# Patient Record
Sex: Female | Born: 2000
Health system: Southern US, Community
[De-identification: ages and names within clinical notes are randomized; demographics above are authoritative.]

## PROBLEM LIST (undated history)

## (undated) DIAGNOSIS — E041 Nontoxic single thyroid nodule: Secondary | ICD-10-CM

## (undated) DIAGNOSIS — Z8489 Family history of other specified conditions: Secondary | ICD-10-CM

## (undated) DIAGNOSIS — F909 Attention-deficit hyperactivity disorder, unspecified type: Secondary | ICD-10-CM

## (undated) DIAGNOSIS — L709 Acne, unspecified: Secondary | ICD-10-CM

## (undated) HISTORY — PX: WISDOM TOOTH EXTRACTION: SHX21

---
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
## 2020-01-19 MED FILL — AMPHETAMINE-DEXTROAMPHETAMI: 20 | 30 days supply | Qty: 30 | Fill #0

## 2020-02-17 DIAGNOSIS — Z0000 Encounter for general adult medical examination without abnormal findings: Secondary | ICD-10-CM | POA: Diagnosis not present

## 2020-04-24 ENCOUNTER — Ambulatory Visit (INDEPENDENT_AMBULATORY_CARE_PROVIDER_SITE_OTHER): Payer: 59 | Admitting: Nurse Practitioner

## 2020-04-24 ENCOUNTER — Encounter: Payer: Self-pay | Admitting: Nurse Practitioner

## 2020-04-24 ENCOUNTER — Other Ambulatory Visit: Payer: Self-pay

## 2020-04-24 VITALS — BP 110/68 | HR 63 | Temp 98.2°F | Ht 68.0 in | Wt 133.0 lb

## 2020-04-24 DIAGNOSIS — Z7689 Persons encountering health services in other specified circumstances: Secondary | ICD-10-CM

## 2020-04-24 DIAGNOSIS — R21 Rash and other nonspecific skin eruption: Secondary | ICD-10-CM | POA: Diagnosis not present

## 2020-04-24 DIAGNOSIS — F909 Attention-deficit hyperactivity disorder, unspecified type: Secondary | ICD-10-CM | POA: Insufficient documentation

## 2020-04-24 NOTE — Assessment & Plan Note (Signed)
Acute, ongoing.  With no itchiness, redness, swelling, or oozing, likely a contact dermatitis or dry skin.  Advised to start daily hydration with non-scented moisturizer and return to clinic if symptoms worsen.

## 2020-04-24 NOTE — Assessment & Plan Note (Signed)
Chronic, ongoing.  Takes Adderall very sparingly, only before exams during school.  Continue to follow with Psychiatrist in [place].

## 2020-04-24 NOTE — Progress Notes (Signed)
BP 110/68   Pulse 63   Temp 98.2 F (36.8 C) (Oral)   Ht 5\' 8"  (1.727 m)   Wt 133 lb (60.3 kg)   SpO2 97%   BMI 20.22 kg/m    Subjective:    Patient ID: Tammie Burns, female    DOB: 2001/05/21, 19 y.o.   MRN: [id number]  HPI: Tammie Burns is a 19 y.o. female presenting for new patient visit to establish care.  Introduced to Designer, jewellery role and practice setting.  All questions answered.  Discussed provider/patient relationship and expectations.  Chief Complaint  Patient presents with  . Establish Care   Patient is a Barrister's clerk at [employer] .    ADHD Follows with a Teacher, music in [place].  Take Adderall 10 mg before test  ADHD status: controlled Satisfied with current therapy: yes Medication compliance:  excellent compliance  Previous psychiatry evaluation: yes; Dr. Darleene Cleaver Previous medications: yes adderall 10 mg   Taking meds on weekends/vacations: yes Work/school performance:  good Difficulty sustaining attention/completing tasks: yes Distracted by extraneous stimuli: yes Does not listen when spoken to: no  Fidgets with hands or feet: no Unable to stay in seat: no Blurts out/interrupts others: no ADHD Medication Side Effects: yes    Decreased appetite: no    Headache: no    Sleeping disturbance pattern: no    Irritability: yes    Rebound effects (worse than baseline) off medication: no    Anxiousness: no    Dizziness: no    Tics: no  RASH Duration:  days  Location: legs  Itching: no Burning: no Redness: no Oozing: no Scaling: no Blisters: no Painful: no Fevers: no Change in detergents/soaps/personal care products: no Recent illness: no Recent travel:no History of same: no Context: same Alleviating factors: nothing tried Treatments attempted: nothing tried Shortness of breath: no  Throat/tongue swelling: no Myalgias/arthralgias: no   No Known Allergies  Outpatient Encounter Medications as of 04/24/2020    Medication Sig  . amphetamine-dextroamphetamine (ADDERALL) 20 MG tablet Take 10 mg by mouth daily.  . norgestimate-ethinyl estradiol (SPRINTEC 28) 0.25-35 MG-MCG tablet TAKE 1 TABLET BY MOUTH EVERY DAY   No facility-administered encounter medications on file as of 04/24/2020.   Active Ambulatory Problems    Diagnosis Date Noted  . ADHD 04/24/2020  . Rash 04/24/2020   Resolved Ambulatory Problems    Diagnosis Date Noted  . No Resolved Ambulatory Problems   No Additional Past Medical History   History reviewed. No pertinent past medical history.  Family History  Problem Relation Age of Onset  . Skin cancer Mother   . Heart disease Father   . Skin cancer Father   . Prostate cancer Father    History reviewed. No pertinent surgical history.  Social History   Tobacco Use  . Smoking status: Never Smoker  . Smokeless tobacco: Never Used  Substance Use Topics  . Alcohol use: Yes    Comment: occassionally  . Drug use: Never   Review of Systems  Constitutional: Negative.  Negative for activity change, appetite change, fatigue and fever.  HENT: Negative.  Negative for congestion, ear pain, postnasal drip, rhinorrhea, sinus pressure, sinus pain, sneezing and sore throat.   Eyes: Negative.  Negative for discharge, redness and visual disturbance.  Respiratory: Negative.  Negative for cough, shortness of breath and wheezing.   Cardiovascular: Negative.  Negative for chest pain, palpitations and leg swelling.  Gastrointestinal: Negative.  Negative for blood in stool, constipation, diarrhea, nausea and vomiting.  Genitourinary: Negative.  Negative for dysuria, hematuria and menstrual problem.  Musculoskeletal: Negative.  Negative for arthralgias, back pain and myalgias.  Skin: Positive for rash. Negative for color change and wound.  Neurological: Negative.  Negative for dizziness, weakness, light-headedness and headaches.  Psychiatric/Behavioral: Negative.  Negative for confusion,  decreased concentration, sleep disturbance and suicidal ideas. The patient is not nervous/anxious.    Per HPI unless specifically indicated above     Objective:    BP 110/68   Pulse 63   Temp 98.2 F (36.8 C) (Oral)   Ht 5\' 8"  (1.727 m)   Wt 133 lb (60.3 kg)   SpO2 97%   BMI 20.22 kg/m   Wt Readings from Last 3 Encounters:  04/24/20 133 lb (60.3 kg) (62 %, Z= 0.31)*   * Growth percentiles are based on CDC (Girls, 2-20 Years) data.    Physical Exam Vitals and nursing note reviewed.  Constitutional:      General: She is not in acute distress.    Appearance: Normal appearance. She is normal weight. She is not toxic-appearing.  HENT:     Head: Normocephalic and atraumatic.     Right Ear: Tympanic membrane, ear canal and external ear normal. There is no impacted cerumen.     Left Ear: Tympanic membrane, ear canal and external ear normal. There is no impacted cerumen.     Nose: Nose normal. No congestion or rhinorrhea.     Mouth/Throat:     Mouth: Mucous membranes are moist.     Pharynx: Oropharynx is clear. No oropharyngeal exudate.  Eyes:     General: No scleral icterus.    Extraocular Movements: Extraocular movements intact.     Pupils: Pupils are equal, round, and reactive to light.  Cardiovascular:     Rate and Rhythm: Normal rate and regular rhythm.     Pulses: Normal pulses.     Heart sounds: Normal heart sounds. No murmur.  Pulmonary:     Effort: Pulmonary effort is normal. No respiratory distress.     Breath sounds: Normal breath sounds. No wheezing, rhonchi or rales.  Abdominal:     General: Abdomen is flat. Bowel sounds are normal. There is no distension.     Palpations: There is no mass.  Musculoskeletal:        General: Normal range of motion.     Cervical back: Normal range of motion. No rigidity.     Right lower leg: No edema.     Left lower leg: No edema.  Skin:    General: Skin is warm and dry.     Capillary Refill: Capillary refill takes less than 2  seconds.     Coloration: Skin is not jaundiced or pale.     Findings: Rash (papular rash to bilateral lower extremities) present.  Neurological:     General: No focal deficit present.     Mental Status: She is alert.     Motor: No weakness.     Gait: Gait normal.  Psychiatric:        Mood and Affect: Mood normal.        Behavior: Behavior normal.        Thought Content: Thought content normal.        Judgment: Judgment normal.       Assessment & Plan:   Problem List Items Addressed This Visit      Musculoskeletal and Integument   Rash    Acute, ongoing.  With no itchiness, redness, swelling,  or oozing, likely a contact dermatitis or dry skin.  Advised to start daily hydration with non-scented moisturizer and return to clinic if symptoms worsen.        Other   ADHD - Primary    Chronic, ongoing.  Takes Adderall very sparingly, only before exams during school.  Continue to follow with Psychiatrist in [place].       Other Visit Diagnoses    Encounter to establish care           Follow up plan: Return if symptoms worsen or fail to improve.

## 2020-05-12 ENCOUNTER — Ambulatory Visit: Payer: 59

## 2020-05-17 ENCOUNTER — Ambulatory Visit: Payer: 59

## 2020-05-19 ENCOUNTER — Ambulatory Visit
Admission: RE | Admit: 2020-05-19 | Discharge: 2020-05-19 | Disposition: A | Discharge status: Home / Self Care | Payer: 59 | Source: Ambulatory Visit | Attending: Otolaryngology | Admitting: Otolaryngology

## 2020-05-19 ENCOUNTER — Other Ambulatory Visit: Payer: Self-pay

## 2020-05-19 DIAGNOSIS — E041 Nontoxic single thyroid nodule: Secondary | ICD-10-CM | POA: Insufficient documentation

## 2020-05-19 DIAGNOSIS — E042 Nontoxic multinodular goiter: Secondary | ICD-10-CM | POA: Diagnosis not present

## 2020-05-23 ENCOUNTER — Other Ambulatory Visit: Payer: Self-pay | Admitting: Otolaryngology

## 2020-05-23 DIAGNOSIS — E041 Nontoxic single thyroid nodule: Secondary | ICD-10-CM

## 2020-08-14 DIAGNOSIS — F900 Attention-deficit hyperactivity disorder, predominantly inattentive type: Secondary | ICD-10-CM | POA: Diagnosis not present

## 2020-09-18 IMAGING — US US THYROID
1 series · 15 of 25 positions shown · non-contrast
Comparison: 05/12/2019

CLINICAL DATA: 19-year-old female with a history of thyroid nodules

EXAM:
THYROID ULTRASOUND
TECHNIQUE: Ultrasound examination of the thyroid gland and adjacent soft
tissues was performed.

[Series 1: us thyroid · 15 of 44 slices shown]
[im 1/44]
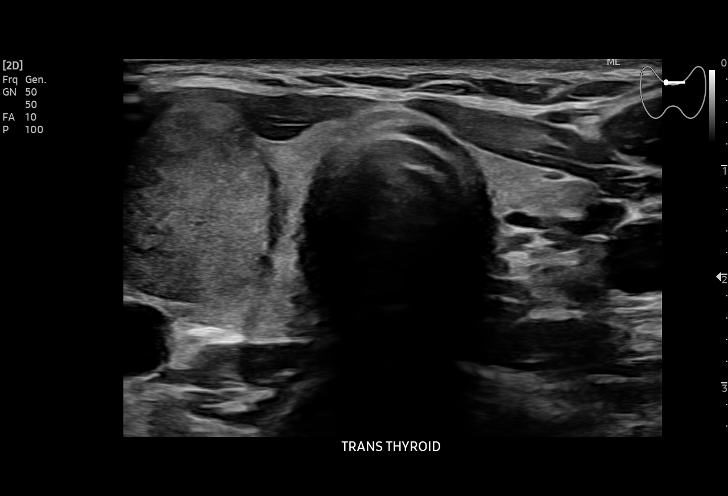
[im 4/44]
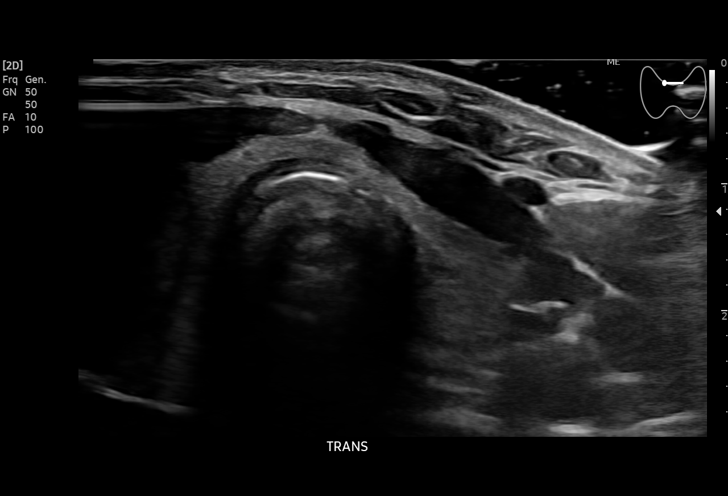
[im 8/44]
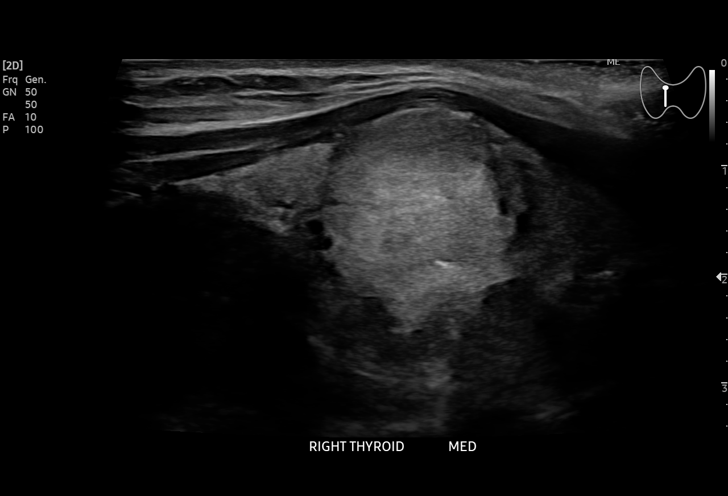
[im 9/44]
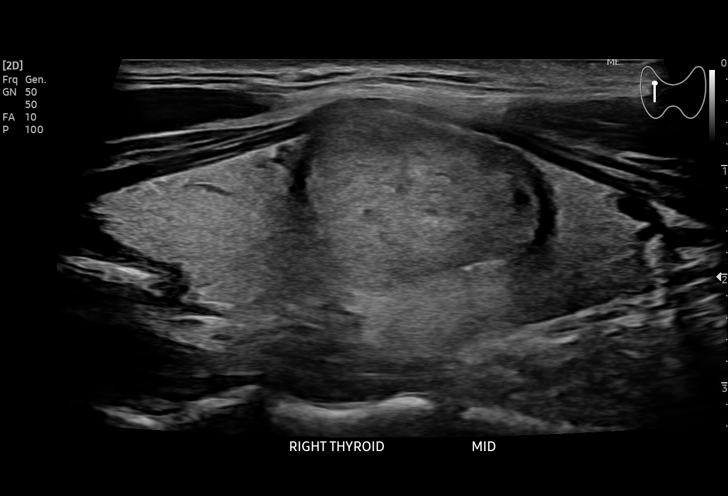
[im 13/44]
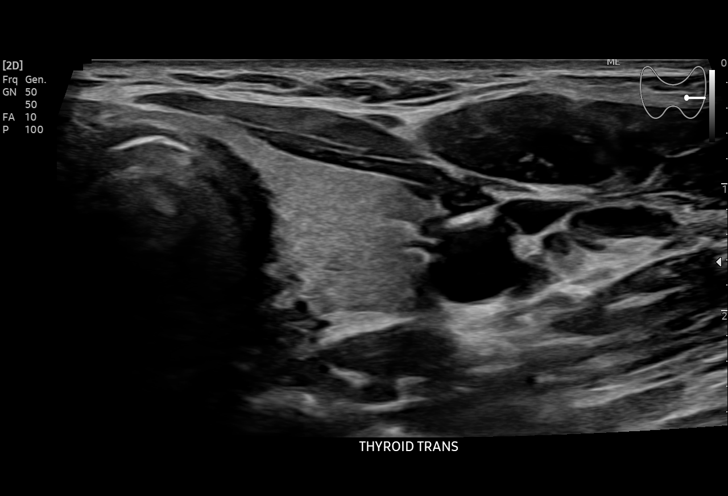
[im 17/44]
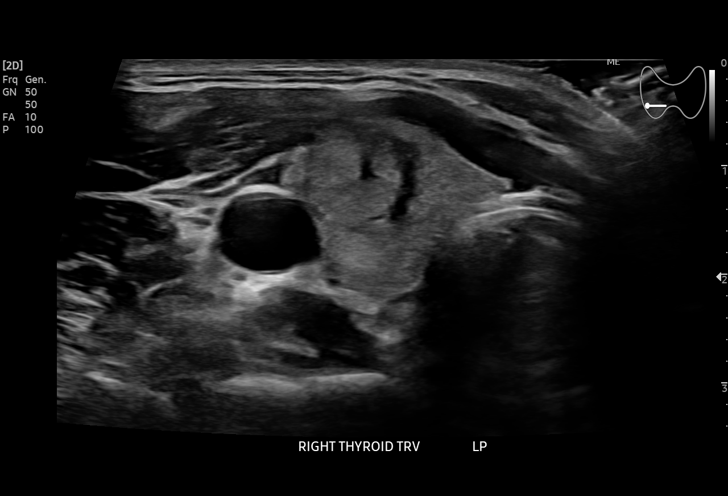
[im 18/44]
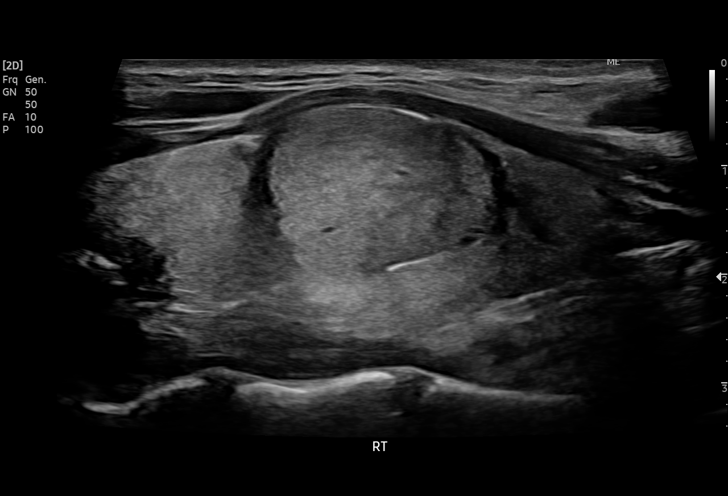
[im 22/44]
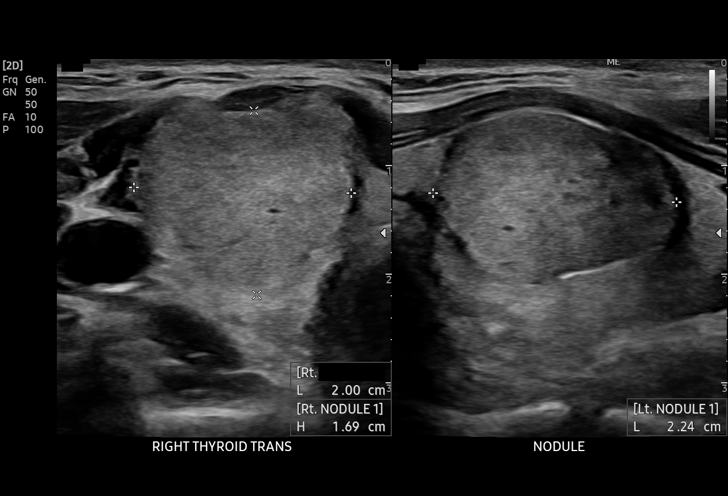
[im 26/44]
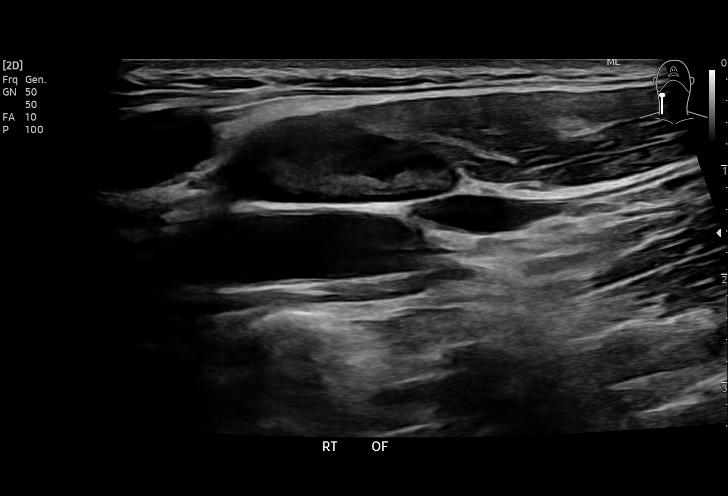
[im 27/44]
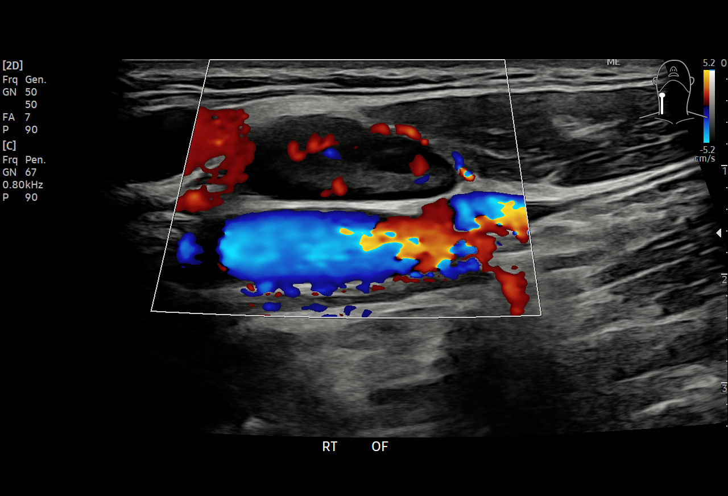
[im 31/44]
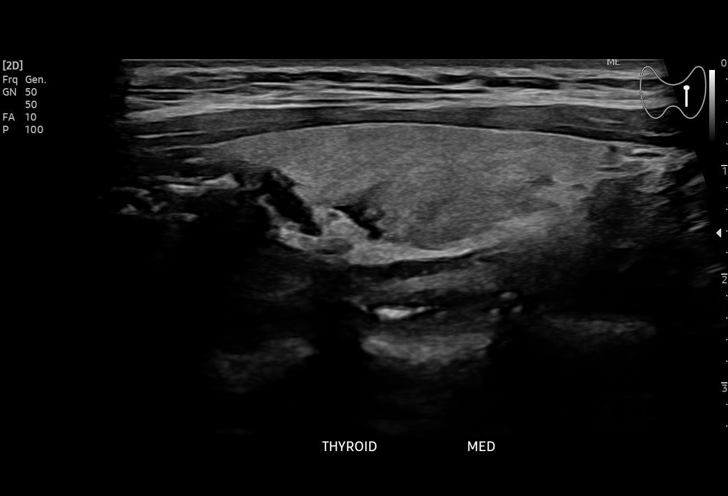
[im 35/44]
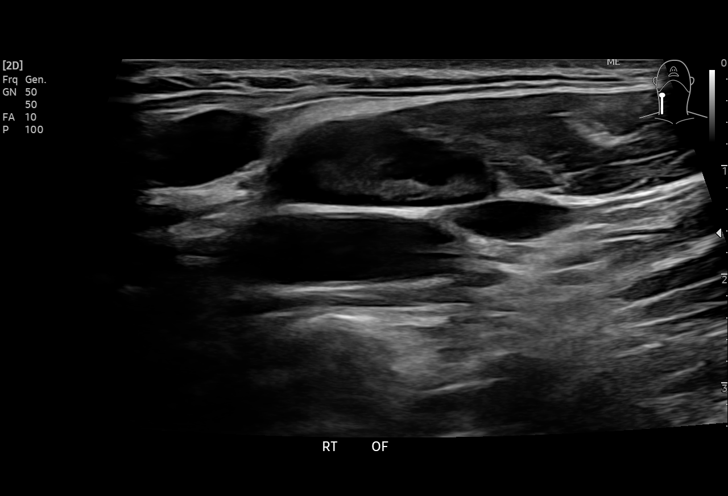
[im 36/44]
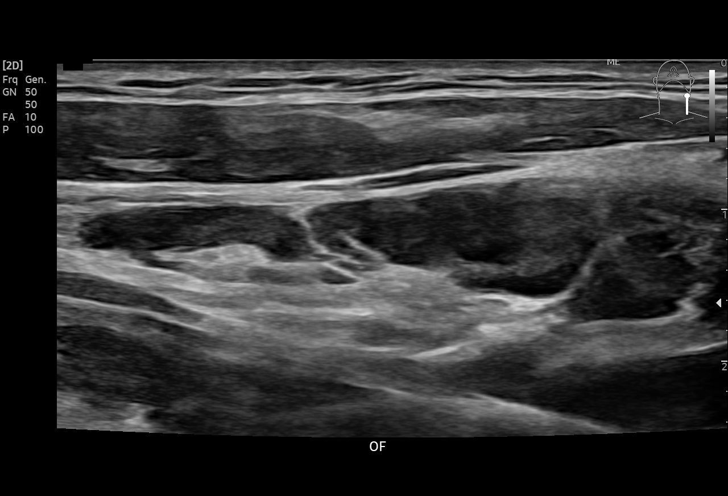
[im 40/44]
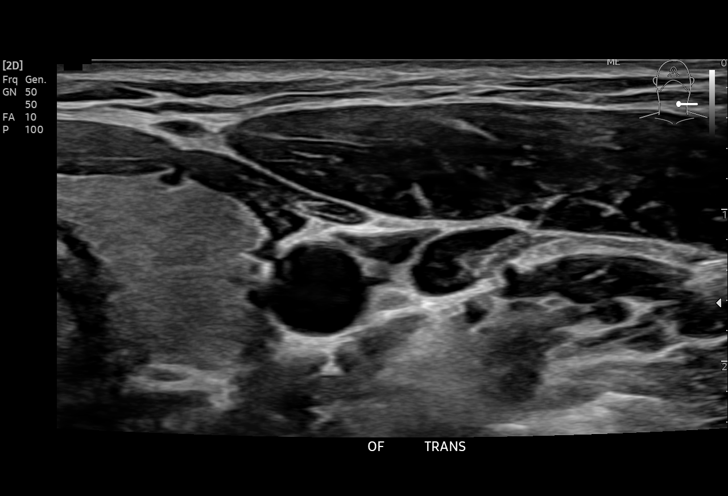
[im 44/44]
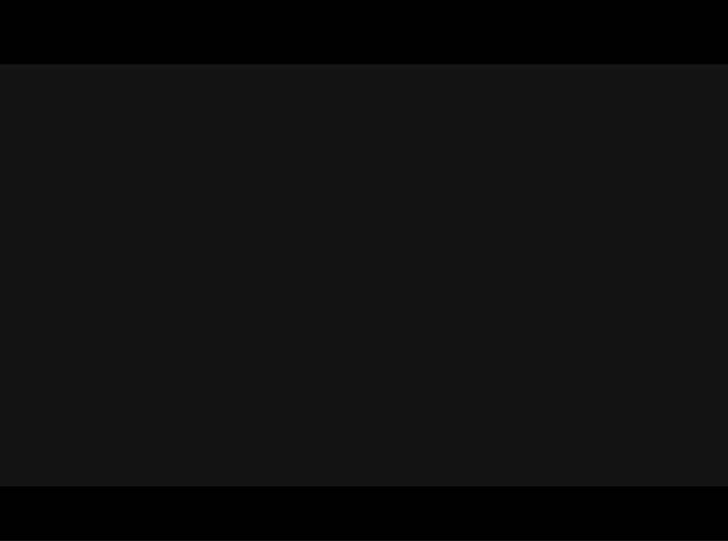

[15 of 25 positions shown; findings below may reference images not displayed]

FINDINGS: Parenchymal Echotexture: Normal

Isthmus: 0.3 cm

Right lobe: 5.4 cm x 1.8 cm x 2.0 cm

Left lobe: 4.6 cm x 1.8 cm x 1.7 cm

_________________________________________________________

Estimated total number of nodules >/= 1 cm: 1

Number of spongiform nodules >/=  2 cm not described below (TR1): 0

Number of mixed cystic and solid nodules >/= 1.5 cm not described
below (TR2): 0

_________________________________________________________

Nodule of the right thyroid, labeled 1, is relatively unchanged,
cm compared to prior of 2.3 cm. This remains TR 3 and meets
continued criteria for surveillance

No adenopathy
IMPRESSION: Right mid thyroid nodule (TR 3, 2.2 cm) meets criteria for continued
surveillance, as designated by the newly established ACR TI-RADS
criteria. Surveillance ultrasound study recommended to be performed
annually up to 5 years.

Recommendations follow those established by the new ACR TI-RADS
criteria ([HOSPITAL] 4107;[DATE]).

## 2020-10-03 DIAGNOSIS — F900 Attention-deficit hyperactivity disorder, predominantly inattentive type: Secondary | ICD-10-CM | POA: Diagnosis not present

## 2020-10-10 ENCOUNTER — Other Ambulatory Visit (HOSPITAL_COMMUNITY): Payer: Self-pay | Admitting: Psychiatry

## 2020-10-10 MED FILL — AMPHETAMINE-DEXTROAMPHETAMI: 20 | 30 days supply | Qty: 30 | Fill #0

## 2020-12-18 ENCOUNTER — Other Ambulatory Visit (HOSPITAL_COMMUNITY): Payer: Self-pay | Admitting: Psychiatry

## 2021-03-20 ENCOUNTER — Other Ambulatory Visit (HOSPITAL_COMMUNITY): Payer: Self-pay

## 2021-03-20 MED ORDER — AMPHETAMINE-DEXTROAMPHETAMINE 20 MG PO TABS
ORAL_TABLET | ORAL | 0 refills | Status: DC
  Filled 2021-04-18: qty 30, 30d supply, fill #0

## 2021-03-20 MED ORDER — AMPHETAMINE-DEXTROAMPHETAMINE 20 MG PO TABS: ORAL_TABLET | ORAL | 0 refills | Status: DC

## 2021-03-20 MED ORDER — AMPHETAMINE-DEXTROAMPHETAMINE 20 MG PO TABS
ORAL_TABLET | ORAL | 0 refills | Status: DC
  Filled 2021-06-12: qty 30, 30d supply, fill #0

## 2021-03-21 ENCOUNTER — Other Ambulatory Visit (HOSPITAL_COMMUNITY): Payer: Self-pay

## 2021-03-22 ENCOUNTER — Other Ambulatory Visit (HOSPITAL_COMMUNITY): Payer: Self-pay

## 2021-04-18 ENCOUNTER — Other Ambulatory Visit (HOSPITAL_COMMUNITY): Payer: Self-pay

## 2021-05-18 ENCOUNTER — Other Ambulatory Visit: Payer: Self-pay

## 2021-05-18 ENCOUNTER — Ambulatory Visit
Admission: RE | Admit: 2021-05-18 | Discharge: 2021-05-18 | Disposition: A | Discharge status: Home / Self Care | Payer: BC Managed Care – PPO | Source: Ambulatory Visit | Attending: Otolaryngology | Admitting: Otolaryngology

## 2021-05-18 DIAGNOSIS — E041 Nontoxic single thyroid nodule: Secondary | ICD-10-CM | POA: Diagnosis not present

## 2021-05-28 ENCOUNTER — Other Ambulatory Visit: Payer: Self-pay | Admitting: Otolaryngology

## 2021-05-28 DIAGNOSIS — E041 Nontoxic single thyroid nodule: Secondary | ICD-10-CM

## 2021-06-12 ENCOUNTER — Other Ambulatory Visit (HOSPITAL_COMMUNITY): Payer: Self-pay

## 2021-06-19 ENCOUNTER — Other Ambulatory Visit: Payer: Self-pay

## 2021-06-19 ENCOUNTER — Ambulatory Visit
Admission: RE | Admit: 2021-06-19 | Discharge: 2021-06-19 | Disposition: A | Discharge status: Home / Self Care | Payer: BC Managed Care – PPO | Source: Ambulatory Visit | Attending: Otolaryngology | Admitting: Otolaryngology

## 2021-06-19 DIAGNOSIS — E041 Nontoxic single thyroid nodule: Secondary | ICD-10-CM | POA: Insufficient documentation

## 2021-06-19 NOTE — Procedures (Signed)
PROCEDURE SUMMARY:  Using direct ultrasound guidance, 5 passes were made using 27 g needles into the nodule within the right mid lobe of the thyroid.   Ultrasound was used to confirm needle placements on all occasions.   EBL = trace  Specimens were sent to Pathology for analysis.  See procedure note under Imaging tab in Epic for full procedure details.  Armando Gang Hays Dunnigan PA-C 06/19/2021 2:53 PM

## 2021-06-20 LAB — CYTOLOGY - NON PAP

## 2021-06-25 ENCOUNTER — Other Ambulatory Visit (HOSPITAL_COMMUNITY): Payer: Self-pay

## 2021-06-25 MED ORDER — AMPHETAMINE-DEXTROAMPHETAMINE 20 MG PO TABS
ORAL_TABLET | ORAL | 0 refills | Status: DC
  Filled 2021-09-11: qty 30, 30d supply, fill #0

## 2021-06-25 MED ORDER — AMPHETAMINE-DEXTROAMPHETAMINE 20 MG PO TABS: ORAL_TABLET | ORAL | 0 refills | Status: DC

## 2021-09-11 ENCOUNTER — Other Ambulatory Visit (HOSPITAL_COMMUNITY): Payer: Self-pay

## 2021-09-17 IMAGING — US US THYROID
1 series · 15 of 25 positions shown · non-contrast
Comparison: 05/19/2020

05/12/2019

CLINICAL DATA: Thyroid nodule follow-up

EXAM:
THYROID ULTRASOUND
TECHNIQUE: Ultrasound examination of the thyroid gland and adjacent soft
tissues was performed.

[Series 1: us thyroid · 15 of 34 slices shown]
[im 1/34]
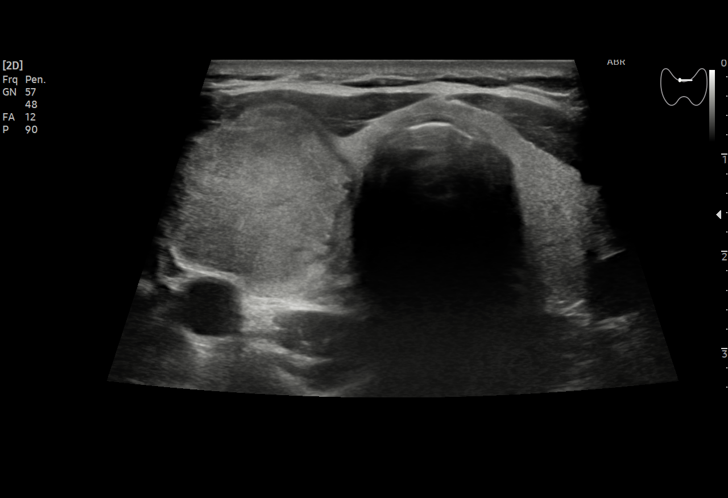
[im 3/34]
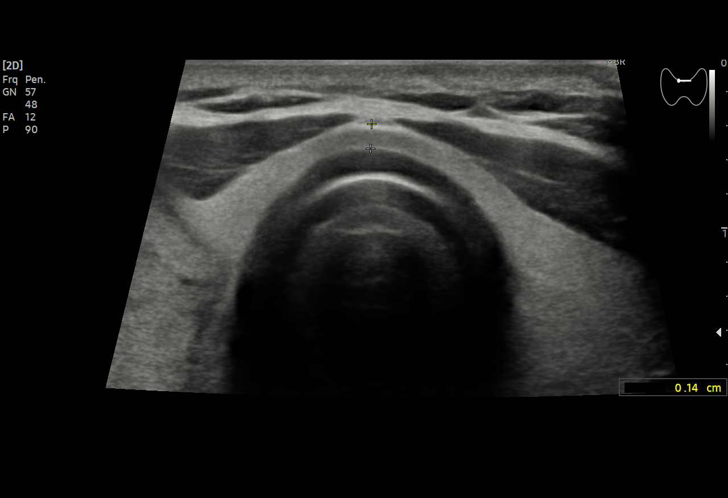
[im 6/34]
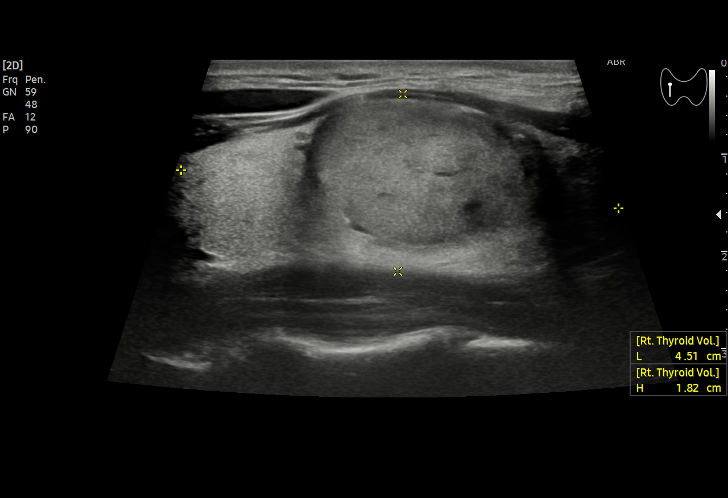
[im 7/34]
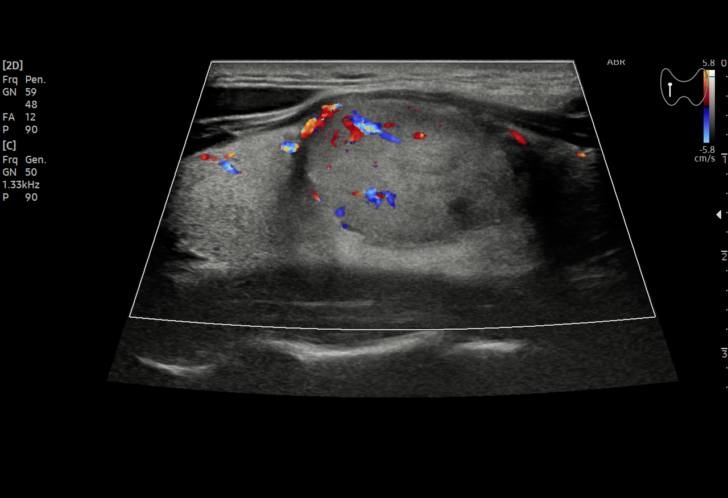
[im 10/34]
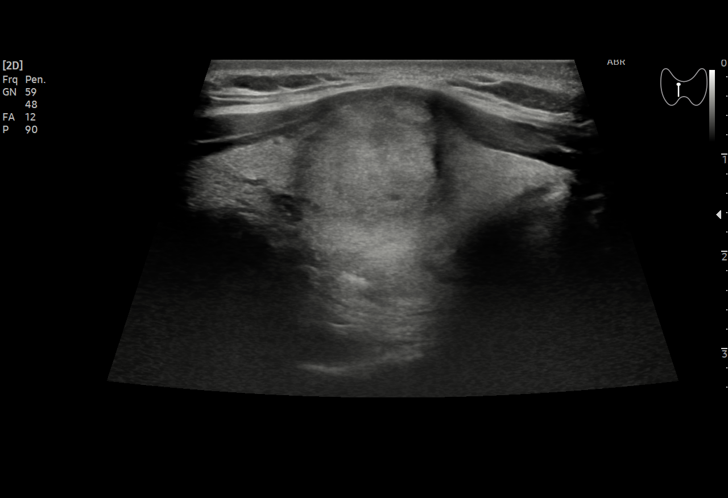
[im 13/34]
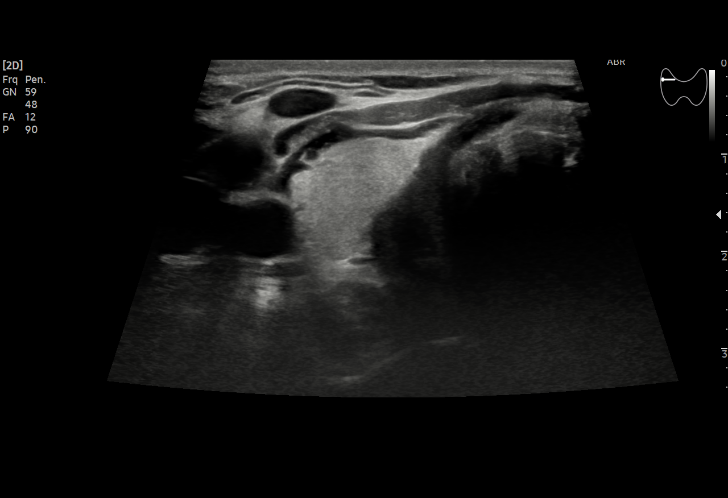
[im 14/34]
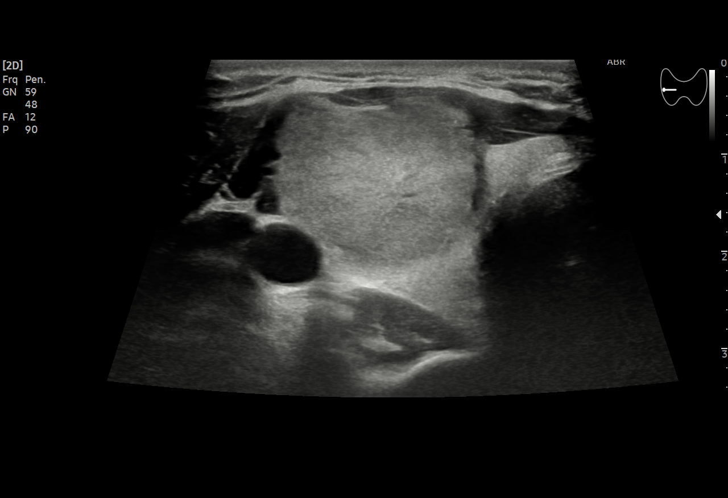
[im 17/34]
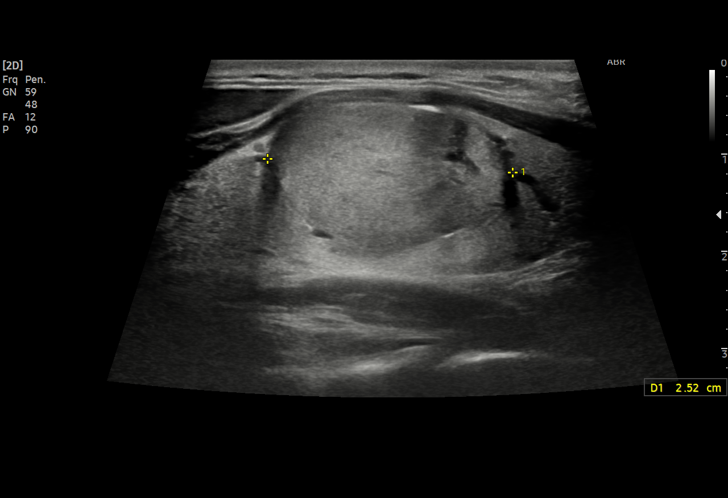
[im 20/34]
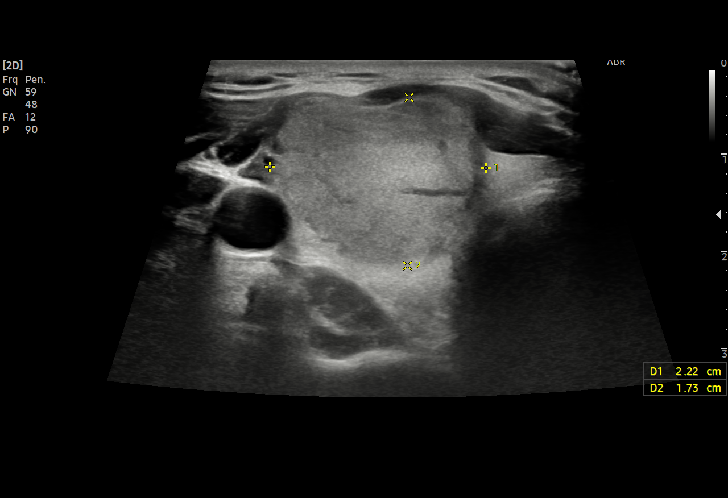
[im 21/34]
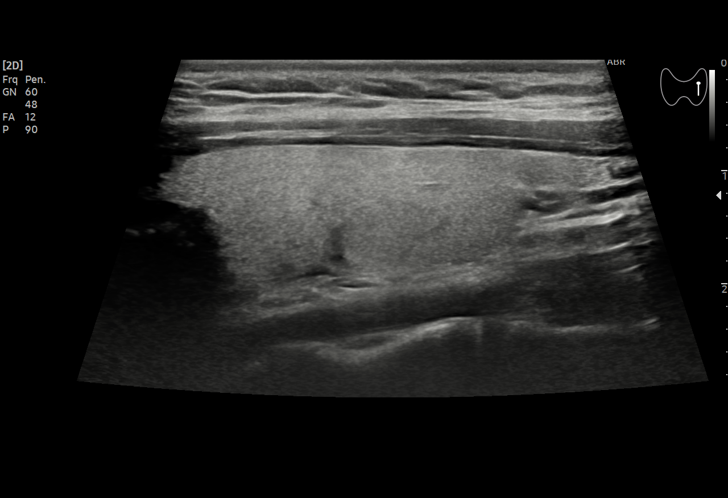
[im 24/34]
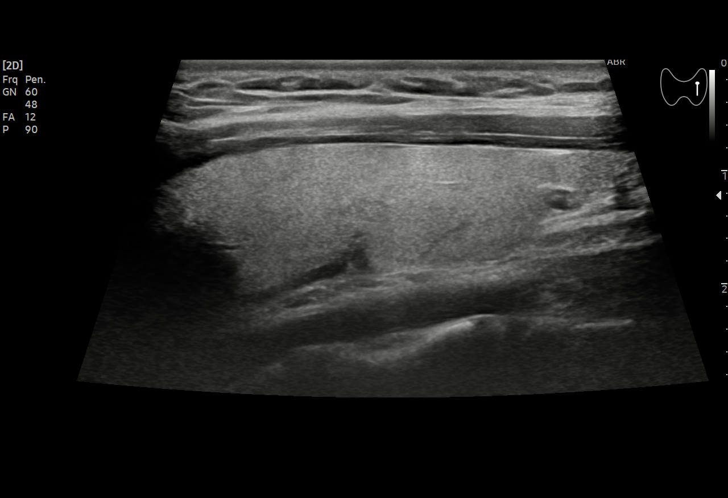
[im 27/34]
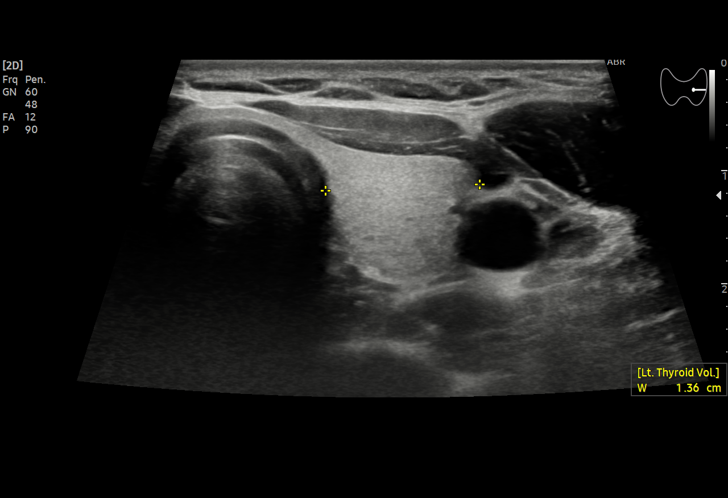
[im 28/34]
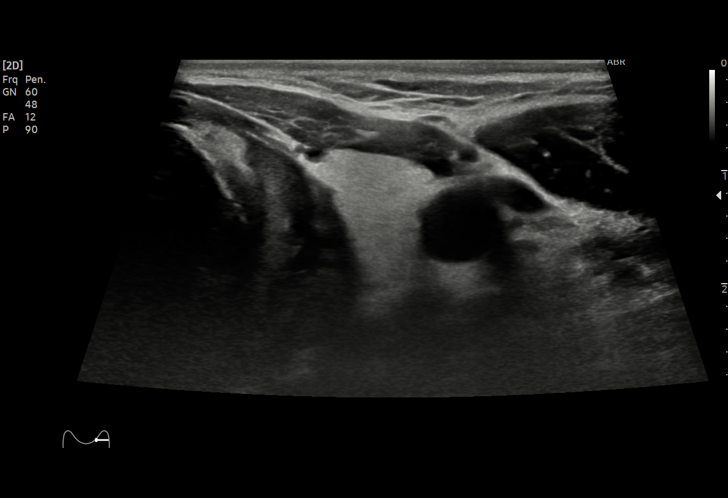
[im 31/34]
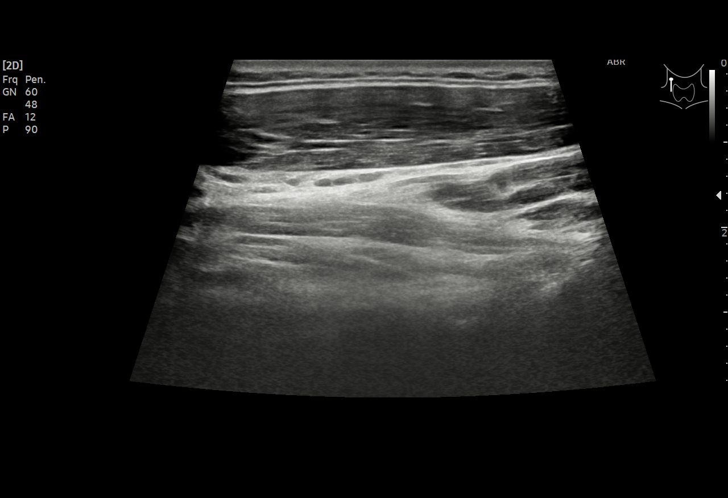
[im 34/34]
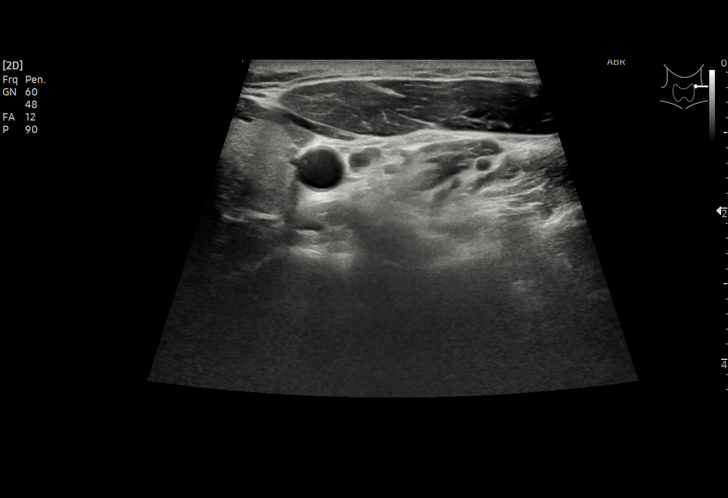

[15 of 25 positions shown; findings below may reference images not displayed]

FINDINGS: Parenchymal Echotexture: Normal

Isthmus: 0.1 cm

Right lobe: 4.5 x 1.8 x 2.4 cm

Left lobe: 3.9 x 1.2 x 1.4 cm

_________________________________________________________

Estimated total number of nodules >/= 1 cm: 1

Number of spongiform nodules >/=  2 cm not described below (TR1): 0

Number of mixed cystic and solid nodules >/= 1.5 cm not described
below (TR2): 0

_________________________________________________________

Nodule # 1:

Prior biopsy: No

Location: Right; mid

Maximum size: 2.5 cm; Other 2 dimensions: 2.2 x 1.7 cm, previously,
2.3 x 1.7 x 1.7 cm on 05/12/2019

Composition: solid/almost completely solid (2)

Echogenicity: isoechoic (1)

Shape: not taller-than-wide (0)

Margins: smooth (0)

Echogenic foci: none (0)

ACR TI-RADS total points: 3.

ACR TI-RADS risk category:  TR3 (3 points).

Significant change in size (>/= 20% in two dimensions and minimal
increase of 2 mm): No

Change in features: No

Change in ACR TI-RADS risk category: No

ACR TI-RADS recommendations:

**Given size (>/= 2.5 cm) and appearance, fine needle aspiration of
this mildly suspicious nodule should be considered based on TI-RADS
criteria.

_________________________________________________________
IMPRESSION: Nodule 1 located in the inferior right thyroid lobe demonstrates
minimal interval growth and now meets criteria for FNA.

The above is in keeping with the ACR TI-RADS recommendations - [HOSPITAL] 7468;[DATE].

## 2021-10-19 IMAGING — US US FNA BIOPSY THYROID 1ST LESION
1 series · 12 of 12 positions shown · non-contrast
Comparison: Thyroid US on 05/18/2021

MEDICATIONS:
5 ML 1 % lidocaine

COMPLICATIONS:
None immediate.

INDICATION: Indeterminate thyroid nodule in the right mid lobe.

EXAM:
ULTRASOUND GUIDED FINE NEEDLE ASPIRATION OF INDETERMINATE THYROID
NODULE
TECHNIQUE: Informed written consent was obtained from the patient after a
discussion of the risks, benefits and alternatives to treatment.
Questions regarding the procedure were encouraged and answered. A
timeout was performed prior to the initiation of the procedure.

[Series 2: us fna biopsy thyroid 1st lesion · 0.06mm/px · 12 acquisitions, 12 frames shown]
[im 1/12]
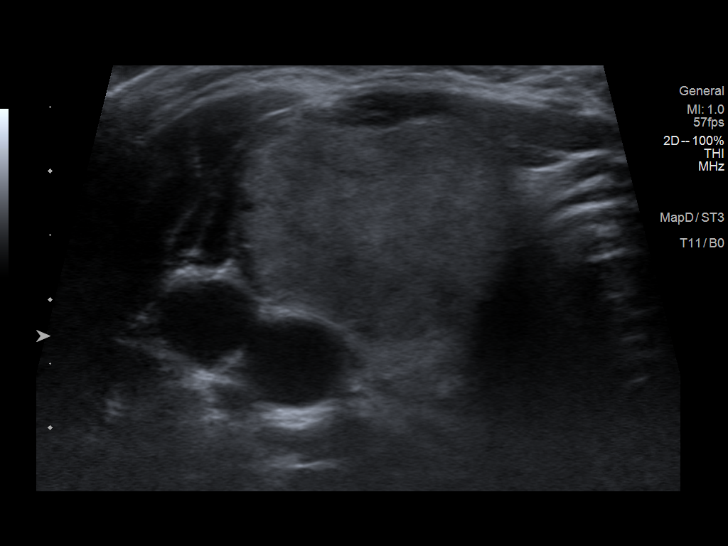
[im 2/12]
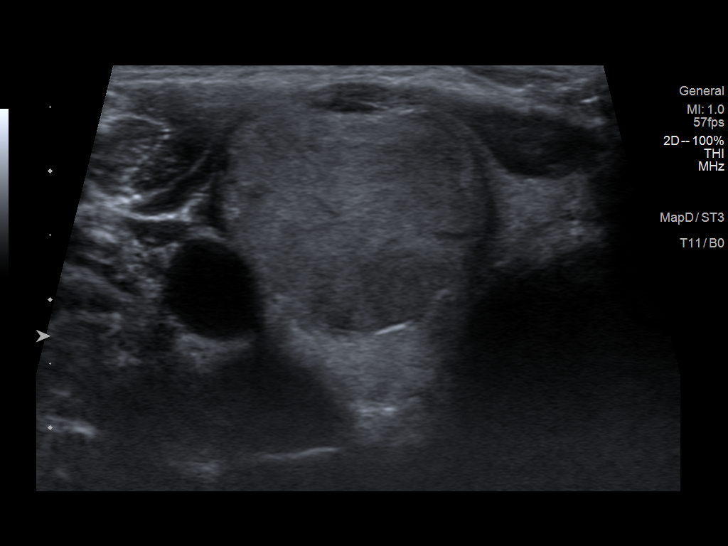
[im 3/12]
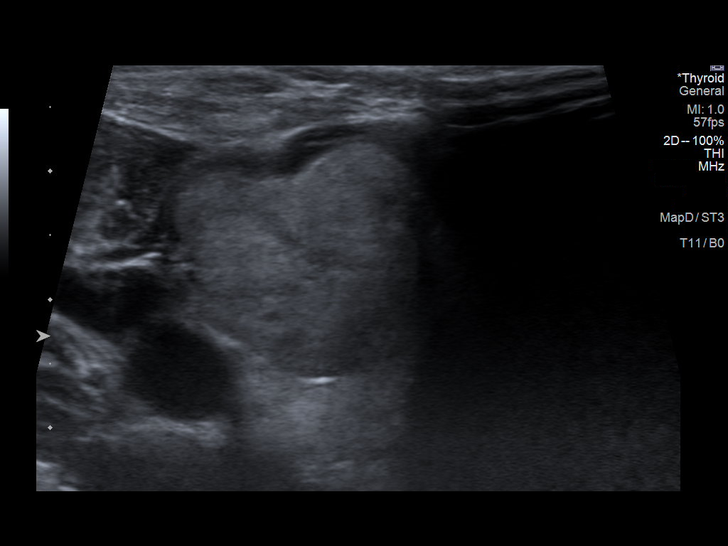
[im 4/12]
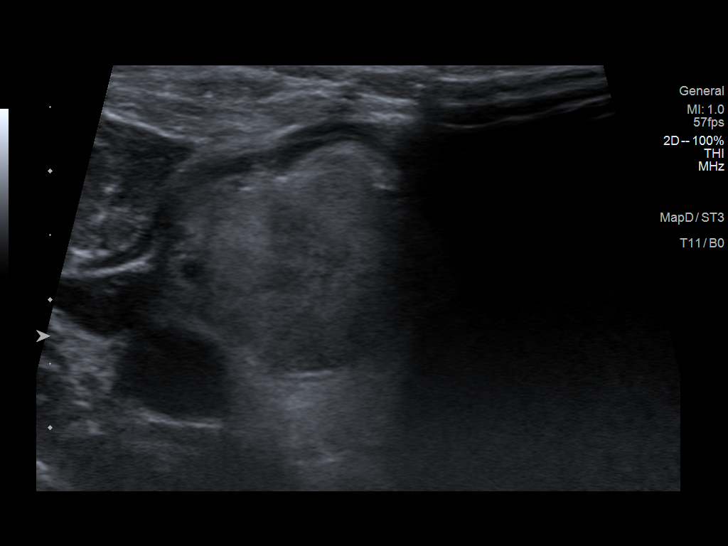
[im 5/12]
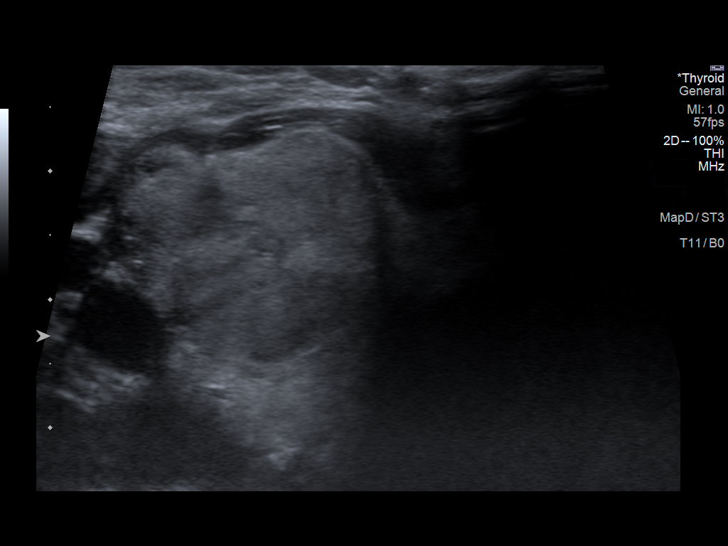
[im 6/12]
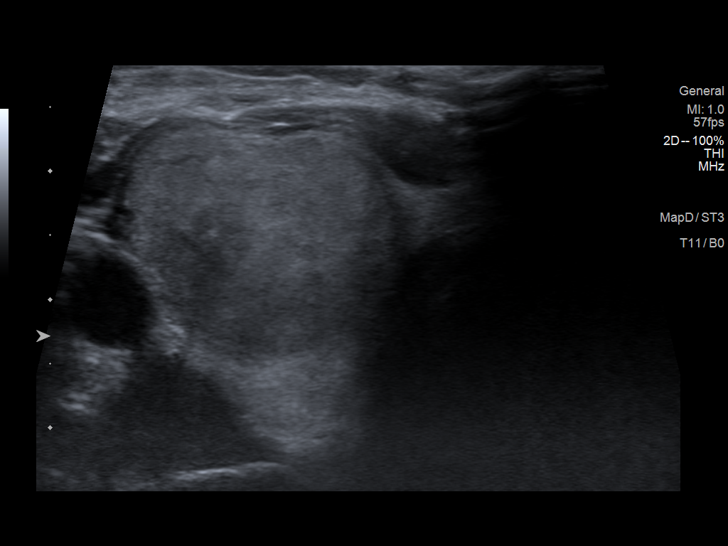
[im 7/12]
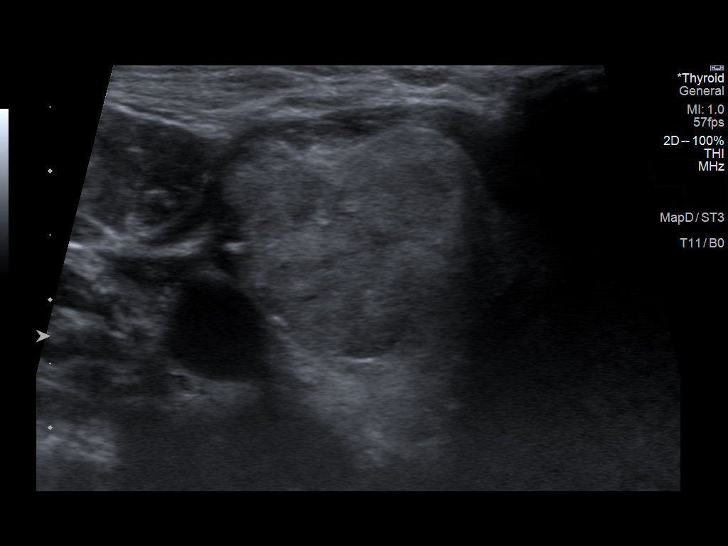
[im 8/12]
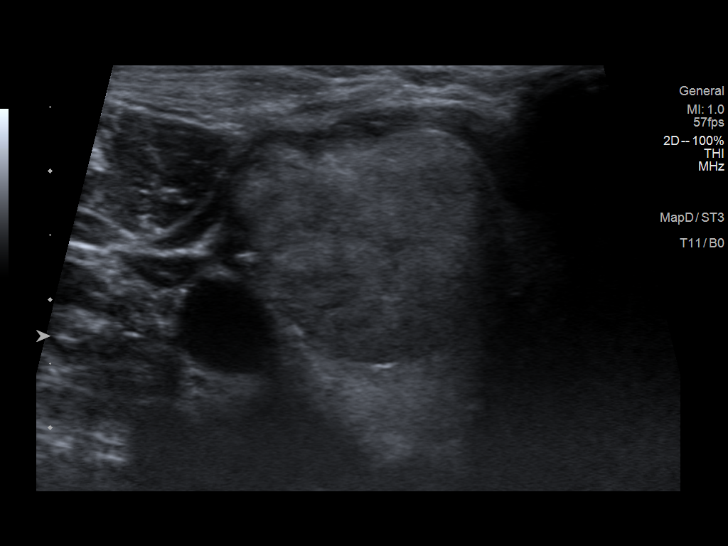
[im 9/12]
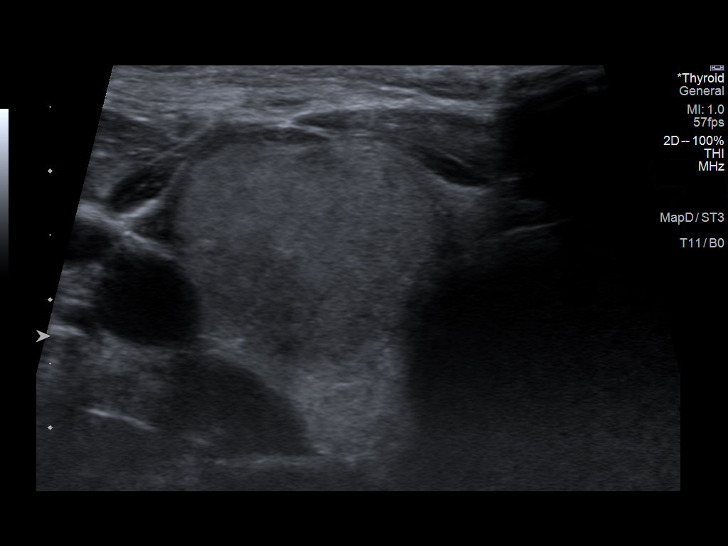
[im 10/12]
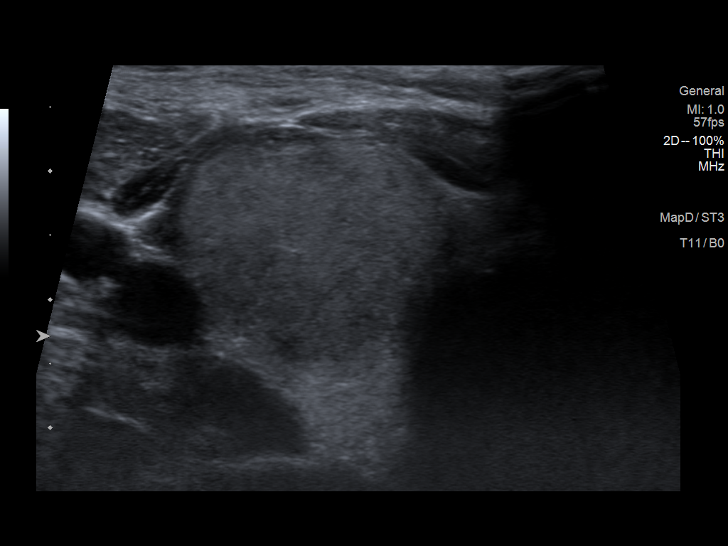
[im 11/12]
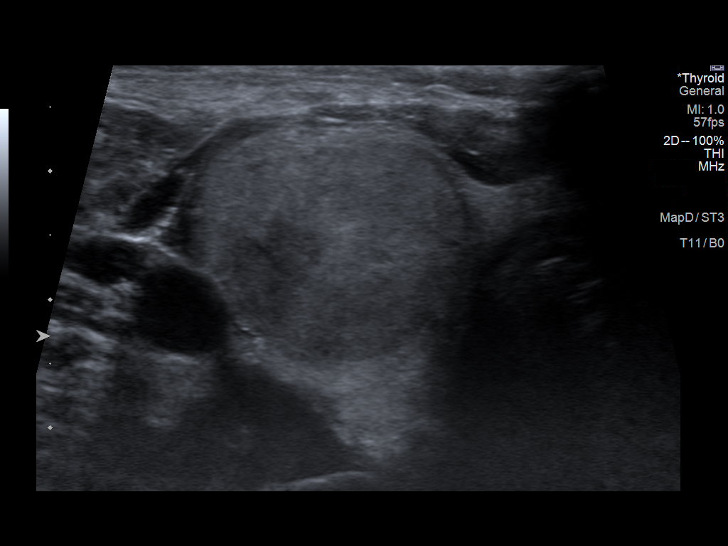
[im 12/12]
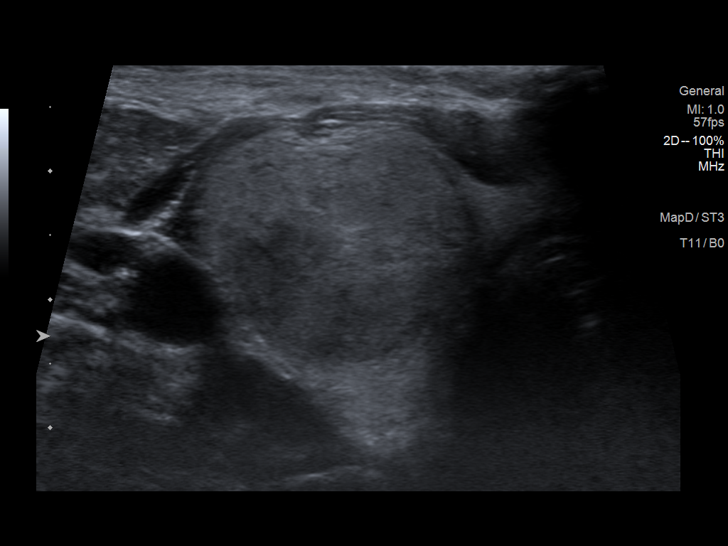

[12 of 12 positions shown; findings below may reference images not displayed]

Pre-procedural ultrasound scanning demonstrated unchanged size and
appearance of the indeterminate nodule within the right mid thyroid
lobe.

The procedure was planned. The neck was prepped in the usual sterile
fashion, and a sterile drape was applied covering the operative
field. A timeout was performed prior to the initiation of the
procedure. Local anesthesia was provided with 1% lidocaine.

Under direct ultrasound guidance, 5 FNA biopsies were performed of
the right mid thyroid nodule with a 27 gauge needle. Two of these
samples were obtained for AFIRMA. Multiple ultrasound images were
saved for procedural documentation purposes. The samples were
prepared and submitted to pathology.

Limited post procedural scanning was negative for hematoma or
additional complication. Dressings were placed. The patient
tolerated the above procedures procedure well without immediate
postprocedural complication.
FINDINGS: Nodule reference number based on prior diagnostic ultrasound: 1

Maximum size: 2.5 cm

Location: Right; Mid

ACR TI-RADS risk category: TR3 (3 points)

Reason for biopsy: meets ACR TI-RADS criteria

Ultrasound imaging confirms appropriate placement of the needles
within the thyroid nodule.
IMPRESSION: Technically successful ultrasound guided fine needle aspiration of
the right mid thyroid nodule.

## 2022-03-11 ENCOUNTER — Other Ambulatory Visit (HOSPITAL_COMMUNITY): Payer: Self-pay

## 2022-03-13 ENCOUNTER — Other Ambulatory Visit (HOSPITAL_COMMUNITY): Payer: Self-pay

## 2022-09-08 DIAGNOSIS — Z0489 Encounter for examination and observation for other specified reasons: Secondary | ICD-10-CM | POA: Diagnosis not present

## 2023-04-18 DIAGNOSIS — L700 Acne vulgaris: Secondary | ICD-10-CM | POA: Diagnosis not present

## 2023-04-18 DIAGNOSIS — Z7189 Other specified counseling: Secondary | ICD-10-CM | POA: Diagnosis not present

## 2023-04-18 DIAGNOSIS — D225 Melanocytic nevi of trunk: Secondary | ICD-10-CM | POA: Diagnosis not present

## 2023-07-01 DIAGNOSIS — E041 Nontoxic single thyroid nodule: Secondary | ICD-10-CM | POA: Diagnosis not present

## 2023-07-16 DIAGNOSIS — Z124 Encounter for screening for malignant neoplasm of cervix: Secondary | ICD-10-CM | POA: Diagnosis not present

## 2023-07-18 ENCOUNTER — Encounter
Admission: RE | Admit: 2023-07-18 | Discharge: 2023-07-18 | Disposition: A | Discharge status: Home / Self Care | Payer: BC Managed Care – PPO | Source: Ambulatory Visit | Attending: Otolaryngology | Admitting: Otolaryngology

## 2023-07-18 VITALS — Ht 69.0 in | Wt 150.0 lb

## 2023-07-18 DIAGNOSIS — Z01818 Encounter for other preprocedural examination: Secondary | ICD-10-CM

## 2023-07-18 DIAGNOSIS — Z79899 Other long term (current) drug therapy: Secondary | ICD-10-CM

## 2023-07-18 DIAGNOSIS — Z01812 Encounter for preprocedural laboratory examination: Secondary | ICD-10-CM

## 2023-07-18 HISTORY — DX: Family history of other specified conditions: Z84.89

## 2023-07-18 HISTORY — DX: Acne, unspecified: L70.9

## 2023-07-18 HISTORY — DX: Attention-deficit hyperactivity disorder, unspecified type: F90.9

## 2023-07-18 HISTORY — DX: Nontoxic single thyroid nodule: E04.1

## 2023-07-18 NOTE — Patient Instructions (Signed)
Your procedure is scheduled on:07-29-23 Tuesday Report to the Registration Desk on the 1st floor of the [facility].Then proceed to the 2nd floor Surgery Desk To find out your arrival time, please call [phone] between 1PM - 3PM on:07-28-23 Monday If your arrival time is 6:00 am, do not arrive before that time as the [facility] entrance doors do not open until 6:00 am.  REMEMBER: Instructions that are not followed completely may result in serious medical risk, up to and including death; or upon the discretion of your surgeon and anesthesiologist your surgery may need to be rescheduled.  Do not eat food after midnight the night before surgery.  No gum chewing or hard candies.  You may however, drink CLEAR liquids up to 2 hours before you are scheduled to arrive for your surgery. Do not drink anything within 2 hours of your scheduled arrival time.  Clear liquids include: - water  - apple juice without pulp - gatorade (not RED colors) - black coffee or tea (Do NOT add milk or creamers to the coffee or tea) Do NOT drink anything that is not on this list.  One week prior to surgery:Last dose on 07-21-23  Stop Anti-inflammatories (NSAIDS) such as Advil, Aleve, Ibuprofen, Motrin, Naproxen, Naprosyn and Aspirin based products such as Excedrin, Goody's Powder, BC Powder.You may however, take Tylenol if needed for pain up until the day of surgery. Stop ANY OVER THE COUNTER supplements/vitamins 7 days prior to surgery    Continue taking all prescribed medications   Do NOT take any medication the day of surgery  No Alcohol for 24 hours before or after surgery.  No Smoking including e-cigarettes for 24 hours before surgery.  No chewable tobacco products for at least 6 hours before surgery.  No nicotine patches on the day of surgery.  Do not use any "recreational" drugs for at least a week (preferably 2 weeks) before your surgery.  Please be advised that the combination of cocaine and  anesthesia may have negative outcomes, up to and including death. If you test positive for cocaine, your surgery will be cancelled.  On the morning of surgery brush your teeth with toothpaste and water, you may rinse your mouth with mouthwash if you wish. Do not swallow any toothpaste or mouthwash.  Use CHG Soap as directed on instruction sheet.  Do not wear jewelry, make-up, hairpins, clips or nail polish.  Do not wear lotions, powders, or perfumes.   Do not shave body hair from the neck down 48 hours before surgery.  Contact lenses, hearing aids and dentures may not be worn into surgery.  Do not bring valuables to the hospital. [facility] is not responsible for any missing/lost belongings or valuables.  Notify your doctor if there is any change in your medical condition (cold, fever, infection).  Wear comfortable clothing (specific to your surgery type) to the hospital.  After surgery, you can help prevent lung complications by doing breathing exercises.  Take deep breaths and cough every 1-2 hours. Your doctor may order a device called an Incentive Spirometer to help you take deep breaths. When coughing or sneezing, hold a pillow firmly against your incision with both hands. This is called "splinting." Doing this helps protect your incision. It also decreases belly discomfort.  If you are being admitted to the hospital overnight, leave your suitcase in the car. After surgery it may be brought to your room.  In case of increased patient census, it may be necessary for you,  the patient, to continue your postoperative care in the Same Day Surgery department.  If you are being discharged the day of surgery, you will not be allowed to drive home. You will need a responsible individual to drive you home and stay with you for 24 hours after surgery.   If you are taking public transportation, you will need to have a responsible individual with you.  Please call the Pre-admissions  Testing Dept. at [phone] if you have any questions about these instructions.  Surgery Visitation Policy:  Patients having surgery or a procedure may have two visitors.  Children under the age of 18 must have an adult with them who is not the patient.     Preparing for Surgery with CHLORHEXIDINE GLUCONATE (CHG) Soap  Chlorhexidine Gluconate (CHG) Soap  o An antiseptic cleaner that kills germs and bonds with the skin to continue killing germs even after washing  o Used for showering the night before surgery and morning of surgery  Before surgery, you can play an important role by reducing the number of germs on your skin.  CHG (Chlorhexidine gluconate) soap is an antiseptic cleanser which kills germs and bonds with the skin to continue killing germs even after washing.  Please do not use if you have an allergy to CHG or antibacterial soaps. If your skin becomes reddened/irritated stop using the CHG.  1. Shower the NIGHT BEFORE SURGERY and the MORNING OF SURGERY with CHG soap.  2. If you choose to wash your hair, wash your hair first as usual with your normal shampoo.  3. After shampooing, rinse your hair and body thoroughly to remove the shampoo.  4. Use CHG as you would any other liquid soap. You can apply CHG directly to the skin and wash gently with a scrungie or a clean washcloth.  5. Apply the CHG soap to your body only from the neck down. Do not use on open wounds or open sores. Avoid contact with your eyes, ears, mouth, and genitals (private parts). Wash face and genitals (private parts) with your normal soap.  6. Wash thoroughly, paying special attention to the area where your surgery will be performed.  7. Thoroughly rinse your body with warm water.  8. Do not shower/wash with your normal soap after using and rinsing off the CHG soap.  9. Pat yourself dry with a clean towel.  10. Wear clean pajamas to bed the night before surgery.  12. Place clean sheets on  your bed the night of your first shower and do not sleep with pets.  13. Shower again with the CHG soap on the day of surgery prior to arriving at the hospital.  14. Do not apply any deodorants/lotions/powders.  15. Please wear clean clothes to the hospital.

## 2023-07-22 ENCOUNTER — Inpatient Hospital Stay: Admission: RE | Admit: 2023-07-22 | Payer: BC Managed Care – PPO | Source: Ambulatory Visit

## 2023-07-24 ENCOUNTER — Encounter
Admission: RE | Admit: 2023-07-24 | Discharge: 2023-07-24 | Disposition: A | Discharge status: Home / Self Care | Payer: BC Managed Care – PPO | Source: Ambulatory Visit | Attending: Otolaryngology | Admitting: Otolaryngology

## 2023-07-24 DIAGNOSIS — Z01812 Encounter for preprocedural laboratory examination: Secondary | ICD-10-CM | POA: Insufficient documentation

## 2023-07-24 DIAGNOSIS — Z79899 Other long term (current) drug therapy: Secondary | ICD-10-CM | POA: Diagnosis not present

## 2023-07-24 LAB — BASIC METABOLIC PANEL
Anion gap: 9 (ref 5–15)
BUN: 11 mg/dL (ref 6–20)
CO2: 23 mmol/L (ref 22–32)
Calcium: 8.8 mg/dL — ABNORMAL LOW (ref 8.9–10.3)
Chloride: 103 mmol/L (ref 98–111)
Creatinine, Ser: 0.68 mg/dL (ref 0.44–1.00)
GFR, Estimated: >60 mL/min (ref >60)
Glucose, Bld: 73 mg/dL (ref 70–99)
Potassium: 3.4 mmol/L — ABNORMAL LOW (ref 3.5–5.1)
Sodium: 135 mmol/L (ref 135–145)

## 2023-07-28 MED ORDER — ORAL CARE MOUTH RINSE: 15.0000 mL | Freq: Once | OROMUCOSAL | Status: AC

## 2023-07-28 MED ORDER — LACTATED RINGERS IV SOLN: INTRAVENOUS | Status: DC

## 2023-07-28 MED ORDER — CHLORHEXIDINE GLUCONATE 0.12 % MT SOLN
15.0000 mL | Freq: Once | OROMUCOSAL | Status: AC
  Administered 2023-07-29: 15 mL via OROMUCOSAL

## 2023-07-29 ENCOUNTER — Ambulatory Visit: Payer: BC Managed Care – PPO | Admitting: Urgent Care

## 2023-07-29 ENCOUNTER — Other Ambulatory Visit: Payer: Self-pay

## 2023-07-29 ENCOUNTER — Encounter
Admission: RE | Disposition: A | Discharge status: Home / Self Care | Payer: Self-pay | Source: Home / Self Care | Attending: Otolaryngology

## 2023-07-29 ENCOUNTER — Encounter: Payer: Self-pay | Admitting: Otolaryngology

## 2023-07-29 ENCOUNTER — Ambulatory Visit
Admission: RE | Admit: 2023-07-29 | Discharge: 2023-07-29 | Disposition: A | Discharge status: Home / Self Care | Payer: BC Managed Care – PPO | Attending: Otolaryngology | Admitting: Otolaryngology

## 2023-07-29 ENCOUNTER — Ambulatory Visit: Payer: BC Managed Care – PPO | Admitting: Anesthesiology

## 2023-07-29 DIAGNOSIS — E041 Nontoxic single thyroid nodule: Secondary | ICD-10-CM | POA: Diagnosis not present

## 2023-07-29 DIAGNOSIS — D34 Benign neoplasm of thyroid gland: Secondary | ICD-10-CM | POA: Insufficient documentation

## 2023-07-29 DIAGNOSIS — Z01818 Encounter for other preprocedural examination: Secondary | ICD-10-CM

## 2023-07-29 HISTORY — PX: THYROIDECTOMY: SHX17

## 2023-07-29 LAB — TSH: TSH: 3.587 u[IU]/mL (ref 0.350–4.500)

## 2023-07-29 LAB — POCT PREGNANCY, URINE: Preg Test, Ur: NEGATIVE

## 2023-07-29 SURGERY — THYROIDECTOMY
Anesthesia: General | Laterality: Right

## 2023-07-29 MED ORDER — PROPOFOL 10 MG/ML IV BOLUS
INTRAVENOUS | Status: AC
  Filled 2023-07-29: qty 40

## 2023-07-29 MED ORDER — DOCUSATE SODIUM 100 MG PO CAPS: 100.0000 mg | ORAL_CAPSULE | Freq: Two times a day (BID) | ORAL | 0 refills | Status: DC

## 2023-07-29 MED ORDER — FENTANYL CITRATE (PF) 100 MCG/2ML IJ SOLN
INTRAMUSCULAR | Status: AC
  Filled 2023-07-29: qty 2

## 2023-07-29 MED ORDER — PROPOFOL 500 MG/50ML IV EMUL
INTRAVENOUS | Status: DC | PRN
  Administered 2023-07-29: 150 ug/kg/min via INTRAVENOUS

## 2023-07-29 MED ORDER — PROPOFOL 1000 MG/100ML IV EMUL
INTRAVENOUS | Status: AC
  Filled 2023-07-29: qty 100

## 2023-07-29 MED ORDER — GLYCOPYRROLATE 0.2 MG/ML IJ SOLN
INTRAMUSCULAR | Status: AC
  Filled 2023-07-29: qty 1

## 2023-07-29 MED ORDER — MIDAZOLAM HCL 2 MG/2ML IJ SOLN
INTRAMUSCULAR | Status: DC | PRN
  Administered 2023-07-29: 2 mg via INTRAVENOUS

## 2023-07-29 MED ORDER — MIDAZOLAM HCL 2 MG/2ML IJ SOLN
INTRAMUSCULAR | Status: AC
  Filled 2023-07-29: qty 2

## 2023-07-29 MED ORDER — PHENYLEPHRINE HCL-NACL 20-0.9 MG/250ML-% IV SOLN
INTRAVENOUS | Status: DC | PRN
Start: 2023-07-29 — End: 2023-07-29
  Administered 2023-07-29: 40 ug/min via INTRAVENOUS

## 2023-07-29 MED ORDER — DEXAMETHASONE SODIUM PHOSPHATE 10 MG/ML IJ SOLN
INTRAMUSCULAR | Status: DC | PRN
  Administered 2023-07-29: 10 mg via INTRAVENOUS

## 2023-07-29 MED ORDER — ACETAMINOPHEN 10 MG/ML IV SOLN
1000.0000 mg | Freq: Once | INTRAVENOUS | Status: AC
  Administered 2023-07-29: 1000 mg via INTRAVENOUS

## 2023-07-29 MED ORDER — FENTANYL CITRATE (PF) 100 MCG/2ML IJ SOLN
25.0000 ug | INTRAMUSCULAR | Status: DC | PRN
  Administered 2023-07-29 (×4): 25 ug via INTRAVENOUS

## 2023-07-29 MED ORDER — SUCCINYLCHOLINE CHLORIDE 200 MG/10ML IV SOSY
PREFILLED_SYRINGE | INTRAVENOUS | Status: DC | PRN
  Administered 2023-07-29: 100 mg via INTRAVENOUS

## 2023-07-29 MED ORDER — PHENYLEPHRINE HCL-NACL 20-0.9 MG/250ML-% IV SOLN
INTRAVENOUS | Status: AC
  Filled 2023-07-29: qty 250

## 2023-07-29 MED ORDER — ONDANSETRON HCL 4 MG/2ML IJ SOLN
INTRAMUSCULAR | Status: AC
  Filled 2023-07-29: qty 2

## 2023-07-29 MED ORDER — DEXMEDETOMIDINE HCL IN NACL 80 MCG/20ML IV SOLN
INTRAVENOUS | Status: DC | PRN
Start: 2023-07-29 — End: 2023-07-29
  Administered 2023-07-29: 8 ug via INTRAVENOUS

## 2023-07-29 MED ORDER — OXYCODONE HCL 5 MG/5ML PO SOLN: 5.0000 mg | Freq: Once | ORAL | Status: AC | PRN

## 2023-07-29 MED ORDER — FAMOTIDINE 20 MG PO TABS
20.0000 mg | ORAL_TABLET | Freq: Once | ORAL | Status: AC
  Administered 2023-07-29: 20 mg via ORAL

## 2023-07-29 MED ORDER — OXYCODONE HCL 5 MG PO TABS
5.0000 mg | ORAL_TABLET | Freq: Once | ORAL | Status: AC | PRN
  Administered 2023-07-29: 5 mg via ORAL

## 2023-07-29 MED ORDER — FENTANYL CITRATE (PF) 100 MCG/2ML IJ SOLN
INTRAMUSCULAR | Status: DC | PRN
  Administered 2023-07-29 (×2): 50 ug via INTRAVENOUS
  Administered 2023-07-29: 100 ug via INTRAVENOUS

## 2023-07-29 MED ORDER — FAMOTIDINE 20 MG PO TABS
ORAL_TABLET | ORAL | Status: AC
  Filled 2023-07-29: qty 1

## 2023-07-29 MED ORDER — BUPIVACAINE-EPINEPHRINE (PF) 0.25% -1:200000 IJ SOLN
INTRAMUSCULAR | Status: AC
  Filled 2023-07-29: qty 30

## 2023-07-29 MED ORDER — HYDROCODONE-ACETAMINOPHEN 7.5-325 MG/15ML PO SOLN
10.0000 mL | Freq: Four times a day (QID) | ORAL | 0 refills | Status: DC | PRN
Start: 2023-07-29 — End: 2023-12-10

## 2023-07-29 MED ORDER — OXYCODONE HCL 5 MG PO TABS
ORAL_TABLET | ORAL | Status: AC
  Filled 2023-07-29: qty 1

## 2023-07-29 MED ORDER — ACETAMINOPHEN 10 MG/ML IV SOLN
INTRAVENOUS | Status: AC
  Filled 2023-07-29: qty 100

## 2023-07-29 MED ORDER — ONDANSETRON HCL 4 MG/2ML IJ SOLN
INTRAMUSCULAR | Status: DC | PRN
  Administered 2023-07-29: 4 mg via INTRAVENOUS

## 2023-07-29 MED ORDER — LIDOCAINE HCL (CARDIAC) PF 100 MG/5ML IV SOSY
PREFILLED_SYRINGE | INTRAVENOUS | Status: DC | PRN
  Administered 2023-07-29: 80 mg via INTRAVENOUS

## 2023-07-29 MED ORDER — PROPOFOL 10 MG/ML IV BOLUS
INTRAVENOUS | Status: DC | PRN
Start: 2023-07-29 — End: 2023-07-29
  Administered 2023-07-29 (×2): 50 mg via INTRAVENOUS
  Administered 2023-07-29: 200 mg via INTRAVENOUS

## 2023-07-29 MED ORDER — 0.9 % SODIUM CHLORIDE (POUR BTL) OPTIME
TOPICAL | Status: DC | PRN
  Administered 2023-07-29: 500 mL

## 2023-07-29 MED ORDER — DEXAMETHASONE SODIUM PHOSPHATE 10 MG/ML IJ SOLN
INTRAMUSCULAR | Status: AC
  Filled 2023-07-29: qty 1

## 2023-07-29 MED ORDER — CHLORHEXIDINE GLUCONATE 0.12 % MT SOLN
OROMUCOSAL | Status: AC
  Filled 2023-07-29: qty 15

## 2023-07-29 MED ORDER — PHENYLEPHRINE HCL (PRESSORS) 10 MG/ML IV SOLN
INTRAVENOUS | Status: DC | PRN
  Administered 2023-07-29: 160 ug via INTRAVENOUS

## 2023-07-29 MED ORDER — LIDOCAINE HCL (PF) 2 % IJ SOLN
INTRAMUSCULAR | Status: AC
  Filled 2023-07-29: qty 5

## 2023-07-29 MED ORDER — ONDANSETRON HCL 4 MG PO TABS: 4.0000 mg | ORAL_TABLET | Freq: Three times a day (TID) | ORAL | 0 refills | Status: DC | PRN

## 2023-07-29 MED ORDER — BUPIVACAINE-EPINEPHRINE (PF) 0.25% -1:200000 IJ SOLN
INTRAMUSCULAR | Status: DC | PRN
  Administered 2023-07-29: 6 mL

## 2023-07-29 MED ORDER — BACITRACIN ZINC 500 UNIT/GM EX OINT
TOPICAL_OINTMENT | CUTANEOUS | Status: AC
  Filled 2023-07-29: qty 28.35

## 2023-07-29 SURGICAL SUPPLY — 43 items
ADH SKN CLS APL DERMABOND .7 (GAUZE/BANDAGES/DRESSINGS)
ATTRACTOMAT 16X20 MAGNETIC DRP (DRAPES) ×1 IMPLANT
BLADE SURG 15 STRL LF DISP TIS (BLADE) ×1 IMPLANT
BLADE SURG 15 STRL SS (BLADE) ×1
BULB RESERV EVAC DRAIN JP 100C (MISCELLANEOUS) IMPLANT
CORD BIP STRL DISP 12FT (MISCELLANEOUS) ×1 IMPLANT
DERMABOND ADVANCED .7 DNX12 (GAUZE/BANDAGES/DRESSINGS) IMPLANT
DRAIN JP 10F RND SILICONE (MISCELLANEOUS) IMPLANT
DRSG TEGADERM 2-3/8X2-3/4 SM (GAUZE/BANDAGES/DRESSINGS) IMPLANT
ELECT CAUTERY BLADE TIP 2.5 (TIP) ×1
ELECT LARYNGEAL DUAL CHAN (ELECTRODE) ×1 IMPLANT
ELECT NEEDLE 20X.3 GREEN (MISCELLANEOUS) ×1
ELECT REM PT RETURN 9FT ADLT (ELECTROSURGICAL) ×1
ELECTRODE CAUTERY BLDE TIP 2.5 (TIP) ×1 IMPLANT
ELECTRODE NDL 20X.3 GREEN (MISCELLANEOUS) ×1 IMPLANT
ELECTRODE NEEDLE 20X.3 GREEN (MISCELLANEOUS) ×1 IMPLANT
ELECTRODE REM PT RTRN 9FT ADLT (ELECTROSURGICAL) ×1 IMPLANT
FORCEPS JEWEL BIP 4-3/4 STR (INSTRUMENTS) ×1 IMPLANT
GAUZE 4X4 16PLY ~~LOC~~+RFID DBL (SPONGE) ×1 IMPLANT
GLOVE BIO SURGEON STRL SZ7.5 (GLOVE) ×2 IMPLANT
GOWN STRL REUS W/ TWL LRG LVL3 (GOWN DISPOSABLE) ×3 IMPLANT
GOWN STRL REUS W/TWL LRG LVL3 (GOWN DISPOSABLE) ×3
HEMOSTAT SURGICEL 2X3 (HEMOSTASIS) ×1 IMPLANT
HOOK STAY BLUNT/RETRACTOR 5M (MISCELLANEOUS) IMPLANT
KIT TURNOVER KIT A (KITS) ×1 IMPLANT
LABEL OR SOLS (LABEL) ×1 IMPLANT
MANIFOLD NEPTUNE II (INSTRUMENTS) ×1 IMPLANT
NS IRRIG 500ML POUR BTL (IV SOLUTION) ×1 IMPLANT
PACK HEAD/NECK (MISCELLANEOUS) ×1 IMPLANT
PROBE NEUROSIGN BIPOL (MISCELLANEOUS) ×1 IMPLANT
PROBE NEUROSIGN BIPOLAR (MISCELLANEOUS) ×1
SHEARS HARMONIC 9CM CVD (BLADE) ×1 IMPLANT
SOL PREP PVP 2OZ (MISCELLANEOUS) ×1
SOLUTION PREP PVP 2OZ (MISCELLANEOUS) ×1 IMPLANT
SPONGE KITTNER 5P (MISCELLANEOUS) ×1 IMPLANT
STRIP CLOSURE SKIN 1/4X4 (GAUZE/BANDAGES/DRESSINGS) IMPLANT
SUT PROLENE 6 0 P 1 18 (SUTURE) ×1 IMPLANT
SUT SILK 2 0 (SUTURE) ×1
SUT SILK 2 0 SH (SUTURE) ×1 IMPLANT
SUT SILK 2-0 18XBRD TIE 12 (SUTURE) ×1 IMPLANT
SUT VIC AB 4-0 RB1 18 (SUTURE) ×1 IMPLANT
TRAP FLUID SMOKE EVACUATOR (MISCELLANEOUS) ×1 IMPLANT
WATER STERILE IRR 500ML POUR (IV SOLUTION) ×1 IMPLANT

## 2023-07-29 NOTE — Transfer of Care (Signed)
Immediate Anesthesia Transfer of Care Note  Patient: Tammie Burns  Procedure(s) Performed: HEMI THYROIDECTOMY (Right)  Patient Location: PACU  Anesthesia Type:General  Level of Consciousness: drowsy and patient cooperative  Airway & Oxygen Therapy: Patient Spontanous Breathing and Patient connected to face mask oxygen  Post-op Assessment: Report given to RN and Post -op Vital signs reviewed and stable  Post vital signs: Reviewed and stable  Last Vitals:  Vitals Value Taken Time  BP    Temp    Pulse 91 07/29/23 0917  Resp 16 07/29/23 0918  SpO2 98 % 07/29/23 0917  Vitals shown include unfiled device data.  Last Pain:  Vitals:   07/29/23 0613  TempSrc: Oral  PainSc: 0-No pain         Complications: No notable events documented.

## 2023-07-29 NOTE — Anesthesia Preprocedure Evaluation (Addendum)
Anesthesia Evaluation  Patient identified by MRN, date of birth, ID band Patient awake    Reviewed: Allergy & Precautions, NPO status , Patient's Chart, lab work & pertinent test results  History of Anesthesia Complications (+) Family history of anesthesia reaction and history of anesthetic complications  Airway Mallampati: II  TM Distance: >3 FB Neck ROM: full    Dental  (+) Chipped   Pulmonary neg pulmonary ROS, neg shortness of breath   Pulmonary exam normal        Cardiovascular (-) Past MI negative cardio ROS Normal cardiovascular exam     Neuro/Psych  PSYCHIATRIC DISORDERS      negative neurological ROS     GI/Hepatic negative GI ROS, Neg liver ROS,neg GERD  ,,  Endo/Other  negative endocrine ROS    Renal/GU      Musculoskeletal   Abdominal   Peds  Hematology negative hematology ROS (+)   Anesthesia Other Findings Past Medical History: No date: Acne No date: ADHD (attention deficit hyperactivity disorder) No date: Family history of adverse reaction to anesthesia     Comment:  mom-delayed emergence No date: Right thyroid nodule  Past Surgical History: No date: WISDOM TOOTH EXTRACTION  BMI    Body Mass Index: 22.15 kg/m      Reproductive/Obstetrics negative OB ROS                             Anesthesia Physical Anesthesia Plan  ASA: 1  Anesthesia Plan: General ETT   Post-op Pain Management:    Induction: Intravenous  PONV Risk Score and Plan: Ondansetron, Dexamethasone, Midazolam and Treatment may vary due to age or medical condition  Airway Management Planned: Oral ETT  Additional Equipment:   Intra-op Plan:   Post-operative Plan: Extubation in OR  Informed Consent: I have reviewed the patients History and Physical, chart, labs and discussed the procedure including the risks, benefits and alternatives for the proposed anesthesia with the patient or  authorized representative who has indicated his/her understanding and acceptance.     Dental Advisory Given  Plan Discussed with: Anesthesiologist, CRNA and Surgeon  Anesthesia Plan Comments: (Patient unable to remove jewelry, earring and bracelet. Consented for risk of damage to body if they are left on. She voiced assent.  Patient consented for risks of anesthesia including but not limited to:  - adverse reactions to medications - damage to eyes, teeth, lips or other oral mucosa - nerve damage due to positioning  - sore throat or hoarseness - Damage to heart, brain, nerves, lungs, other parts of body or loss of life  Patient voiced understanding.)       Anesthesia Quick Evaluation

## 2023-07-29 NOTE — Anesthesia Procedure Notes (Signed)
Procedure Name: Intubation Date/Time: 07/29/2023 7:42 AM  Performed by: Lysbeth Penner, CRNAPre-anesthesia Checklist: Patient identified, Emergency Drugs available, Suction available and Patient being monitored Patient Re-evaluated:Patient Re-evaluated prior to induction Oxygen Delivery Method: Circle system utilized Preoxygenation: Pre-oxygenation with 100% oxygen Induction Type: IV induction Ventilation: Mask ventilation without difficulty Laryngoscope Size: McGraph and 3 Grade View: Grade I Tube type: Oral (Monitoring strip applied to oral tube. Dr. Andee Poles approved placement prior to intubation) Tube size: 6.5 mm Number of attempts: 1 Airway Equipment and Method: Stylet and Oral airway Placement Confirmation: ETT inserted through vocal cords under direct vision, positive ETCO2 and breath sounds checked- equal and bilateral Secured at: 20 cm Tube secured with: Tape Dental Injury: Teeth and Oropharynx as per pre-operative assessment  Comments: Atraumatic intubation. Dr. Andee Poles at bedside and approved proper tube placement. MDA at bedside.

## 2023-07-29 NOTE — Discharge Instructions (Signed)

## 2023-07-29 NOTE — H&P (Signed)
..  History and Physical paper copy reviewed and updated date of procedure and will be scanned into system.  Patient seen and examined and marked.  

## 2023-07-29 NOTE — Anesthesia Postprocedure Evaluation (Signed)
Anesthesia Post Note  Patient: Tammie Burns  Procedure(s) Performed: HEMI THYROIDECTOMY (Right)  Patient location during evaluation: PACU Anesthesia Type: General Level of consciousness: awake and alert Pain management: pain level controlled Vital Signs Assessment: post-procedure vital signs reviewed and stable Respiratory status: spontaneous breathing, nonlabored ventilation, respiratory function stable and patient connected to nasal cannula oxygen Cardiovascular status: blood pressure returned to baseline and stable Postop Assessment: no apparent nausea or vomiting Anesthetic complications: no   No notable events documented.   Last Vitals:  Vitals:   07/29/23 0930 07/29/23 0935  BP: 107/71   Pulse: 87 89  Resp: 19 (!) 24  Temp:    SpO2: 99% 97%    Last Pain:  Vitals:   07/29/23 0935  TempSrc:   PainSc: 5                  Yevette Edwards

## 2023-07-29 NOTE — Op Note (Signed)
..07/29/2023  9:08 AM    Tammie Burns  [id number]   Pre-Op Dx: Thyroid nodule  Post-op Dx: SAME  Proc: Right Hemithyroidectomy with laryngeal nerve monitoring  Surg:  Roney Mans Lettie Czarnecki  Assistant:  Erline Hau  Anes:  GOT  EBL:  25ccs  Comp:  None  Indications:  Right sided thyroid nodule Bethesda class II on FNA growing in size  Findings: Right recurrent laryngeal nerve identified and preserved, left superior and inferior parathyroid glands identified and preserved.  Large right sided thyroid nodule.  Description of Procedure:  After the patient was identified in hold and the history and physical and consent was reviewed and updated.  The patient was marked in an upright position on the right side and anteriorly along a natural occuring skin crease.  The patient was next taken to the operating room and placed in a supine position.  General endotracheal anesthesia was induced with laryngeal monitor endotracheal tube.  Direct visualization by the surgeon of the tube electrodes in contact with the vocal cords was made..  The patient's anterior marked neck crease was neck injected with 6cc's of 0.25% Marcaine with 1:200,000 Epinephrine.  The patient was next prepped and draped in a sterile normal fashion.  At this time, a 15 blade scalpel was used to make a skin incision along a previously marked anterior neck crease.  Dissection was carefully performed through the subcutaneous tissues with combination of Bovie electrocautery and blunt dissection.  The platysma was incised and anterior neck veins ligated with harmonic scalpel.  The median raphe of the strap muscles was divided in a linear fashion with Bovie electrocautery until the anterior border of the thyroid gland was identified.  The sternohyoid muscle was bluntly dissected away from the large right hemithyroid gland.  The lateral border of the thyroid and the carotid artery was identified.  Dissection bluntly and with Bovie  electrocautery was continued superiorly and inferiorly along the lateral edge of the thyroid.  The superior vessels were identified laterally and then medially with blunt dissection in Joel's space between the larynx and the superior thyroid pole.  Further dissection was continued inferiorly as well.  Once the superior pole was pedicled, the superior thyroid vessels were ligated with Harmonic Scalpel.  The large right hemithyroid was delivered from the wound.  The large right thyroid nodule once delivered from the wound revealed Berry's ligament and the nodule of Zuckerkandl.  Just beneath this nodule, the recurrent laryngeal nerve was identified and stimulated with movement of the arytenoid joint.  The inferior parathyroid was identified adjacent to the nerve along with the superior parathyroid as well.  These were identified and revealed good vascularization.  The nerve was tracked until its insertion into the larynx.  Next, the remaining attachments of Berry's ligament was divided and the isthmus was divided with Harmonic Scalpel.  The wound was copiously irrigated with sterile saline.  Meticulous hemostasis with bipolar was obtained.  Visualization of an intact right recurrent laryngeal nerve that stimulated robustly with the nerve stimulator was made.  The parathyroid glands were intact and well perfused.  Surgicel was placed in the wound bed.  The wound was then closed in a multilayered fashion with vicryl for subcutaneous tissues and Dermabond for skin closure.  At this time the patient was extubated and taken to PACU in good condition.  Plan:  Follow pathology.  Limit activity for 2 weeks.  Follow up next week for post-operative evaluationl.  Ice.  Nikolaos Maddocks  07/29/2023 9:08 AM

## 2023-07-30 ENCOUNTER — Encounter: Payer: Self-pay | Admitting: Otolaryngology

## 2023-09-26 DIAGNOSIS — Z483 Aftercare following surgery for neoplasm: Secondary | ICD-10-CM | POA: Diagnosis not present

## 2023-11-03 DIAGNOSIS — D2239 Melanocytic nevi of other parts of face: Secondary | ICD-10-CM | POA: Diagnosis not present

## 2023-11-03 DIAGNOSIS — L700 Acne vulgaris: Secondary | ICD-10-CM | POA: Diagnosis not present

## 2023-11-03 DIAGNOSIS — D2272 Melanocytic nevi of left lower limb, including hip: Secondary | ICD-10-CM | POA: Diagnosis not present

## 2023-12-09 NOTE — Progress Notes (Unsigned)
There were no vitals taken for this visit.   Subjective:    Patient ID: Tammie Burns, female    DOB: August 25, 2001, 23 y.o.   MRN: [id number]  HPI: Tammie Burns is a 23 y.o. female  No chief complaint on file.  Patient presents to clinic to establish care with new PCP.  Introduced to Publishing rights manager role and practice setting.  All questions answered.  Discussed provider/patient relationship and expectations.  Patient reports a history of ***. Patient denies a history of: Hypertension, Elevated Cholesterol, Diabetes, Thyroid problems, Depression, Anxiety, Neurological problems, and Abdominal problems.   Active Ambulatory Problems    Diagnosis Date Noted   ADHD 04/24/2020   Rash 04/24/2020   Resolved Ambulatory Problems    Diagnosis Date Noted   No Resolved Ambulatory Problems   Past Medical History:  Diagnosis Date   Acne    ADHD (attention deficit hyperactivity disorder)    Family history of adverse reaction to anesthesia    Right thyroid nodule    Past Surgical History:  Procedure Laterality Date   THYROIDECTOMY Right 07/29/2023   Procedure: HEMI THYROIDECTOMY;  Surgeon: Bud Face, MD;  Location: [facility] ORS;  Service: ENT;  Laterality: Right;   WISDOM TOOTH EXTRACTION     Family History  Problem Relation Age of Onset   Skin cancer Mother    Heart disease Father    Skin cancer Father    Prostate cancer Father      Review of Systems  Per HPI unless specifically indicated above     Objective:    There were no vitals taken for this visit.  Wt Readings from Last 3 Encounters:  07/29/23 150 lb (68 kg)  07/18/23 150 lb (68 kg)  04/24/20 133 lb (60.3 kg) (62%, Z= 0.31)*   * Growth percentiles are based on CDC (Girls, 2-20 Years) data.    Physical Exam  Results for orders placed or performed during the hospital encounter of 07/29/23  Surgical pathology   Collection Time: 07/29/23 12:00 AM  Result Value Ref Range   SURGICAL PATHOLOGY       SURGICAL PATHOLOGY [facility] [address], [address] [place], [place] [address] Telephone [phone] or [phone] Fax [phone]  REPORT OF SURGICAL PATHOLOGY   Accession #: [id number] Patient Name: Tammie Burns, Tammie Burns Visit # : [id number]  MRN: [id number] Physician: Bud Face DOB/Age [birth year] (Age: 81) Gender: F Collected Date: 07/29/2023 Received Date: 07/29/2023  FINAL DIAGNOSIS       1. Thyroid, lobectomy, Right :       - FOLLICULAR ADENOMA, 2.5 CM      - NEGATIVE FOR MALIGNANCY      - SEE NOTE       Diagnosis Note : Dr. Oneita Kras reviewed the case and agrees with the above      diagnosis.      DATE SIGNED OUT: 08/06/2023 ELECTRONIC SIGNATURE : Corey Harold M.D., Dossie Arbour., Pathologist, Electronic Signature  MICROSCOPIC DESCRIPTION 1. Additional recuts of blocks 1A through F are examined.  CASE COMMENTS STAINS USED IN DIAGNOSIS: H&E H&E H&E H&E H&E H&E *RECUT 1 SLIDE *RECUT 1 SLIDE *RECUT 1 SLIDE *RECUT 1  SLIDE *RECUT 1 SLIDE *RECUT 1 SLIDE    CLINICAL HISTORY  SPECIMEN(S) OBTAINED 1. Thyroid, lobectomy, Right  SPECIMEN COMMENTS: 1. Long stitch superior pole SPECIMEN CLINICAL INFORMATION:    Gross Description 1. Specimen: Received fresh and placed in formalin at 9:56 a.m. on 07/29/23, and labeled right thyroid.  Specimen integrity (intact/incised/disrupted): Intact, with a suture designating      the superior pole.      Size and weight: 5.0 x 3.2 x 2.1 cm, 13 grams.      External surface: Red brown with mild adhesions and cautery artifact on the      posterior surface. The possible isthmus resection margin in inked yellow, and      the remaining specimen is inked black.      Lesion: Within the mid to inferior lobe, there is a 2.5 x 2.1 x 2.0 cm tan pink,      solid, circumscribed lesion identified.  The lesion abuts the anterior,      posterior and lateral inked surfaces, and measures 0.5 cm to the  isthmus      resection margin.  The capsule is entirely  submitted.      Uninvolved parenchyma: Red brown.      Block Summary: 6 blocks submitted.      A-E= representative sections of lesion.      F= gross uninvolved parenchyma. (KL:gt, 07/30/23)        Report signed out from the following location(s) [place] [address], [place] [address] CLIA #: [id number]  [facility] [address] [place], [place] [address] CLIA #: [id number]   Pregnancy, urine POC   Collection Time: 07/29/23  6:17 AM  Result Value Ref Range   Preg Test, Ur NEGATIVE NEGATIVE  TSH   Collection Time: 07/29/23  7:59 AM  Result Value Ref Range   TSH 3.587 0.350 - 4.500 uIU/mL      Assessment & Plan:   Problem List Items Addressed This Visit   None    Follow up plan: No follow-ups on file.

## 2023-12-10 ENCOUNTER — Encounter: Payer: Self-pay | Admitting: Nurse Practitioner

## 2023-12-10 ENCOUNTER — Ambulatory Visit: Payer: BC Managed Care – PPO | Admitting: Nurse Practitioner

## 2023-12-10 VITALS — BP 122/79 | HR 84 | Temp 97.8°F | Ht 70.25 in | Wt 151.2 lb

## 2023-12-10 DIAGNOSIS — E890 Postprocedural hypothyroidism: Secondary | ICD-10-CM | POA: Diagnosis not present

## 2023-12-10 DIAGNOSIS — F419 Anxiety disorder, unspecified: Secondary | ICD-10-CM

## 2023-12-10 DIAGNOSIS — F909 Attention-deficit hyperactivity disorder, unspecified type: Secondary | ICD-10-CM

## 2023-12-10 DIAGNOSIS — Z7689 Persons encountering health services in other specified circumstances: Secondary | ICD-10-CM | POA: Diagnosis not present

## 2023-12-10 NOTE — Assessment & Plan Note (Signed)
Had partial thyroidectomy in September 2024 due to benign tumor.  Has not had labs done since September. Patient has not eaten today and has passed out in the past with blood work when she didn't eat. Will follow up in 2 weeks for physical and blood work.

## 2023-12-10 NOTE — Assessment & Plan Note (Signed)
Chronic. Has been going on for several years.  Has never been on mediation.  Would like to try therapy.  Open to discussing medication in the future.  Follow up in 3 months.  Call sooner if concerns arise.

## 2023-12-10 NOTE — Assessment & Plan Note (Signed)
Chronic. Previously followed by Psychiatry when she was in college.  Has not been on medication since she graduated.  Doesn't feel like she needs it at this time.  She was on Adderall but felt like it made her angry.  Can discuss other options in the future if needed.

## 2023-12-24 ENCOUNTER — Encounter: Payer: Self-pay | Admitting: Nurse Practitioner

## 2023-12-24 ENCOUNTER — Ambulatory Visit: Payer: BC Managed Care – PPO | Admitting: Nurse Practitioner

## 2023-12-24 VITALS — BP 92/63 | HR 80 | Ht 68.5 in | Wt 149.0 lb

## 2023-12-24 DIAGNOSIS — Z136 Encounter for screening for cardiovascular disorders: Secondary | ICD-10-CM | POA: Diagnosis not present

## 2023-12-24 DIAGNOSIS — Z1159 Encounter for screening for other viral diseases: Secondary | ICD-10-CM

## 2023-12-24 DIAGNOSIS — Z23 Encounter for immunization: Secondary | ICD-10-CM | POA: Diagnosis not present

## 2023-12-24 DIAGNOSIS — E890 Postprocedural hypothyroidism: Secondary | ICD-10-CM | POA: Diagnosis not present

## 2023-12-24 DIAGNOSIS — Z0000 Encounter for general adult medical examination without abnormal findings: Secondary | ICD-10-CM

## 2023-12-24 DIAGNOSIS — Z114 Encounter for screening for human immunodeficiency virus [HIV]: Secondary | ICD-10-CM | POA: Diagnosis not present

## 2023-12-24 LAB — URINALYSIS, ROUTINE W REFLEX MICROSCOPIC
Bilirubin, UA: NEGATIVE
Glucose, UA: NEGATIVE
Ketones, UA: NEGATIVE
Leukocytes,UA: NEGATIVE
Nitrite, UA: NEGATIVE
Protein,UA: NEGATIVE
RBC, UA: NEGATIVE
Specific Gravity, UA: 1.01 (ref 1.005–1.030)
Urobilinogen, Ur: 0.2 mg/dL (ref 0.2–1.0)
pH, UA: 6.5 (ref 5.0–7.5)

## 2023-12-24 NOTE — Progress Notes (Signed)
 BP 92/63 (BP Location: Left Arm, Patient Position: Sitting, Cuff Size: Large)   Pulse 80   Ht 5' 8.5 (1.74 m)   Wt 149 lb (67.6 kg)   SpO2 99%   BMI 22.32 kg/m    Subjective:    Patient ID: Tammie Burns, female    DOB: November 07, 2001, 23 y.o.   MRN: [id number]  HPI: Tammie Burns is a 23 y.o. female presenting on 12/24/2023 for comprehensive medical examination. Current medical complaints include:none  She currently lives with: Menopausal Symptoms: no  Depression Screen done today and results listed below:     12/10/2023    8:46 AM 04/24/2020   10:35 AM  Depression screen PHQ 2/9  Decreased Interest 2 0  Down, Depressed, Hopeless 1 0  PHQ - 2 Score 3 0  Altered sleeping 1 0  Tired, decreased energy 3 0  Change in appetite 1 0  Feeling bad or failure about yourself  2 0  Trouble concentrating 3 2  Moving slowly or fidgety/restless 1 0  Suicidal thoughts 0 0  PHQ-9 Score 14 2  Difficult doing work/chores Somewhat difficult Somewhat difficult    The patient does not have a history of falls. I did complete a risk assessment for falls. A plan of care for falls was documented.   Past Medical History:  Past Medical History:  Diagnosis Date   Acne    ADHD (attention deficit hyperactivity disorder)    Family history of adverse reaction to anesthesia    mom-delayed emergence   Right thyroid  nodule     Surgical History:  Past Surgical History:  Procedure Laterality Date   THYROIDECTOMY Right 07/29/2023   Procedure: HEMI THYROIDECTOMY;  Surgeon: Milissa Hamming, MD;  Location: [facility] ORS;  Service: ENT;  Laterality: Right;   WISDOM TOOTH EXTRACTION      Medications:  Current Outpatient Medications on File Prior to Visit  Medication Sig   spironolactone (ALDACTONE) 50 MG tablet Take 50 mg by mouth at bedtime. Takes for acne only   No current facility-administered medications on file prior to visit.    Allergies:  No Known Allergies  Social History:  Social  History   Socioeconomic History   Marital status: Single    Spouse name: Not on file   Number of children: Not on file   Years of education: Not on file   Highest education level: Bachelor's degree (e.g., BA, AB, BS)  Occupational History   Not on file  Tobacco Use   Smoking status: Never   Smokeless tobacco: Never  Vaping Use   Vaping status: Never Used  Substance and Sexual Activity   Alcohol use: Yes    Comment: occassionally   Drug use: Never   Sexual activity: Yes    Partners: Male    Birth control/protection: Condom    Comment: monogamous  Other Topics Concern   Not on file  Social History Narrative   Not on file   Social Drivers of Health   Financial Resource Strain: Low Risk  (12/23/2023)   Overall Financial Resource Strain (CARDIA)    Difficulty of Paying Living Expenses: Not hard at all  Food Insecurity: No Food Insecurity (12/23/2023)   Hunger Vital Sign    Worried About Running Out of Food in the Last Year: Never true    Ran Out of Food in the Last Year: Never true  Transportation Needs: No Transportation Needs (12/23/2023)   PRAPARE - Administrator, Civil Service (Medical):  No    Lack of Transportation (Non-Medical): No  Physical Activity: Insufficiently Active (12/23/2023)   Exercise Vital Sign    Days of Exercise per Week: 4 days    Minutes of Exercise per Session: 30 min  Stress: Stress Concern Present (12/23/2023)   [employer] of Occupational Health - Occupational Stress Questionnaire    Feeling of Stress : Very much  Social Connections: Moderately Isolated (12/23/2023)   Social Connection and Isolation Panel [NHANES]    Frequency of Communication with Friends and Family: More than three times a week    Frequency of Social Gatherings with Friends and Family: More than three times a week    Attends Religious Services: 1 to 4 times per year    Active Member of [employer] or Organizations: No    Attends Banker Meetings: Never     Marital Status: Never married  Intimate Partner Violence: Not At Risk (12/10/2023)   Humiliation, Afraid, Rape, and Kick questionnaire    Fear of Current or Ex-Partner: No    Emotionally Abused: No    Physically Abused: No    Sexually Abused: No   Social History   Tobacco Use  Smoking Status Never  Smokeless Tobacco Never   Social History   Substance and Sexual Activity  Alcohol Use Yes   Comment: occassionally    Family History:  Family History  Problem Relation Age of Onset   Skin cancer Mother    Heart disease Father    Skin cancer Father    Prostate cancer Father     Past medical history, surgical history, medications, allergies, family history and social history reviewed with patient today and changes made to appropriate areas of the chart.   Review of Systems  Psychiatric/Behavioral:  Positive for depression. Negative for suicidal ideas. The patient is nervous/anxious.    All other ROS negative except what is listed above and in the HPI.      Objective:    BP 92/63 (BP Location: Left Arm, Patient Position: Sitting, Cuff Size: Large)   Pulse 80   Ht 5' 8.5 (1.74 m)   Wt 149 lb (67.6 kg)   SpO2 99%   BMI 22.32 kg/m   Wt Readings from Last 3 Encounters:  12/24/23 149 lb (67.6 kg)  12/10/23 151 lb 3.2 oz (68.6 kg)  07/29/23 150 lb (68 kg)    Physical Exam Vitals and nursing note reviewed.  Constitutional:      General: She is awake. She is not in acute distress.    Appearance: Normal appearance. She is well-developed. She is not ill-appearing.  HENT:     Head: Normocephalic and atraumatic.     Right Ear: Hearing, tympanic membrane, ear canal and external ear normal. No drainage.     Left Ear: Hearing, tympanic membrane, ear canal and external ear normal. No drainage.     Nose: Nose normal.     Right Sinus: No maxillary sinus tenderness or frontal sinus tenderness.     Left Sinus: No maxillary sinus tenderness or frontal sinus tenderness.      Mouth/Throat:     Mouth: Mucous membranes are moist.     Pharynx: Oropharynx is clear. Uvula midline. No pharyngeal swelling, oropharyngeal exudate or posterior oropharyngeal erythema.  Eyes:     General: Lids are normal.        Right eye: No discharge.        Left eye: No discharge.     Extraocular Movements: Extraocular movements intact.  Conjunctiva/sclera: Conjunctivae normal.     Pupils: Pupils are equal, round, and reactive to light.     Visual Fields: Right eye visual fields normal and left eye visual fields normal.  Neck:     Thyroid : No thyromegaly.     Vascular: No carotid bruit.     Trachea: Trachea normal.  Cardiovascular:     Rate and Rhythm: Normal rate and regular rhythm.     Heart sounds: Normal heart sounds. No murmur heard.    No gallop.  Pulmonary:     Effort: Pulmonary effort is normal. No accessory muscle usage or respiratory distress.     Breath sounds: Normal breath sounds.  Chest:  Breasts:    Right: Normal.     Left: Normal.  Abdominal:     General: Bowel sounds are normal.     Palpations: Abdomen is soft. There is no hepatomegaly or splenomegaly.     Tenderness: There is no abdominal tenderness.  Musculoskeletal:        General: Normal range of motion.     Cervical back: Normal range of motion and neck supple.     Right lower leg: No edema.     Left lower leg: No edema.  Lymphadenopathy:     Head:     Right side of head: No submental, submandibular, tonsillar, preauricular or posterior auricular adenopathy.     Left side of head: No submental, submandibular, tonsillar, preauricular or posterior auricular adenopathy.     Cervical: No cervical adenopathy.     Upper Body:     Right upper body: No supraclavicular, axillary or pectoral adenopathy.     Left upper body: No supraclavicular, axillary or pectoral adenopathy.  Skin:    General: Skin is warm and dry.     Capillary Refill: Capillary refill takes less than 2 seconds.     Findings: No rash.   Neurological:     Mental Status: She is alert and oriented to person, place, and time.     Gait: Gait is intact.  Psychiatric:        Attention and Perception: Attention normal.        Mood and Affect: Mood normal.        Speech: Speech normal.        Behavior: Behavior normal. Behavior is cooperative.        Thought Content: Thought content normal.        Judgment: Judgment normal.     Results for orders placed or performed during the hospital encounter of 07/29/23  Surgical pathology   Collection Time: 07/29/23 12:00 AM  Result Value Ref Range   SURGICAL PATHOLOGY      SURGICAL PATHOLOGY [facility] [address] [place], [place] [address] Telephone [phone] or [phone] Fax [phone]  REPORT OF SURGICAL PATHOLOGY   Accession #: [id number] Patient Name: Tammie Burns, Tammie Burns Visit # : [id number]  MRN: [id number] Physician: Milissa Hamming DOB/Age [birth year] (Age: 12) Gender: F Collected Date: 07/29/2023 Received Date: 07/29/2023  FINAL DIAGNOSIS       1. Thyroid , lobectomy, Right :       - FOLLICULAR ADENOMA, 2.5 CM      - NEGATIVE FOR MALIGNANCY      - SEE NOTE       Diagnosis Note : Dr. Janel reviewed the case and agrees with the above      diagnosis.      DATE SIGNED OUT: 08/06/2023 ELECTRONIC SIGNATURE BETHA Carmin M.D.,  Annah SQUIBB., Pathologist, Electronic Signature  MICROSCOPIC DESCRIPTION 1. Additional recuts of blocks 1A through F are examined.  CASE COMMENTS STAINS USED IN DIAGNOSIS: H&E H&E H&E H&E H&E H&E *RECUT 1 SLIDE *RECUT 1 SLIDE *RECUT 1 SLIDE *RECUT 1  SLIDE *RECUT 1 SLIDE *RECUT 1 SLIDE    CLINICAL HISTORY  SPECIMEN(S) OBTAINED 1. Thyroid , lobectomy, Right  SPECIMEN COMMENTS: 1. Long stitch superior pole SPECIMEN CLINICAL INFORMATION:    Gross Description 1. Specimen: Received fresh and placed in formalin at 9:56 a.m. on 07/29/23, and labeled right thyroid .      Specimen  integrity (intact/incised/disrupted): Intact, with a suture designating      the superior pole.      Size and weight: 5.0 x 3.2 x 2.1 cm, 13 grams.      External surface: Red brown with mild adhesions and cautery artifact on the      posterior surface. The possible isthmus resection margin in inked yellow, and      the remaining specimen is inked black.      Lesion: Within the mid to inferior lobe, there is a 2.5 x 2.1 x 2.0 cm tan pink,      solid, circumscribed lesion identified.  The lesion abuts the anterior,      posterior and lateral inked surfaces, and measures 0.5 cm to the isthmus      resection margin.  The capsule is entirely  submitted.      Uninvolved parenchyma: Red brown.      Block Summary: 6 blocks submitted.      A-E= representative sections of lesion.      F= gross uninvolved parenchyma. (KL:gt, 07/30/23)        Report signed out from the following location(s) . [facility] [address] [place], [place] [address] CLIA #: [id number]  [facility] [address] [place], [place] [address] CLIA #: [id number]   Pregnancy, urine POC   Collection Time: 07/29/23  6:17 AM  Result Value Ref Range   Preg Test, Ur NEGATIVE NEGATIVE  TSH   Collection Time: 07/29/23  7:59 AM  Result Value Ref Range   TSH 3.587 0.350 - 4.500 uIU/mL      Assessment & Plan:   Problem List Items Addressed This Visit       Other   H/O partial thyroidectomy   Other Visit Diagnoses       Annual physical exam    -  Primary     Screening for HIV (human immunodeficiency virus)         Encounter for hepatitis C screening test for low risk patient         Screening for ischemic heart disease            Follow up plan: No follow-ups on file.   LABORATORY TESTING:  - Pap smear: up to date  IMMUNIZATIONS:   - Tdap: Tetanus vaccination status reviewed: last tetanus booster within 10 years. - Influenza: Refused - Pneumovax: Not applicable -  Prevnar: Not applicable - COVID: Not applicable - HPV: Up to date - Shingrix vaccine: Not applicable  SCREENING: -Mammogram: Not applicable  - Colonoscopy: Not applicable  - Bone Density: Not applicable  -Hearing Test: Not applicable  -Spirometry: Not applicable   PATIENT COUNSELING:   Advised to take 1 mg of folate supplement per day if capable of pregnancy.   Sexuality: Discussed sexually transmitted diseases, partner selection, use of condoms, avoidance of unintended pregnancy  and contraceptive alternatives.   Advised to avoid cigarette smoking.  I discussed with the patient that most people either abstain from alcohol or drink within safe limits (<=14/week and <=4 drinks/occasion for males, <=7/weeks and <= 3 drinks/occasion for females) and that the risk for alcohol disorders and other health effects rises proportionally with the number of drinks per week and how often a drinker exceeds daily limits.  Discussed cessation/primary prevention of drug use and availability of treatment for abuse.   Diet: Encouraged to adjust caloric intake to maintain  or achieve ideal body weight, to reduce intake of dietary saturated fat and total fat, to limit sodium intake by avoiding high sodium foods and not adding table salt, and to maintain adequate dietary potassium and calcium preferably from fresh fruits, vegetables, and low-fat dairy products.    stressed the importance of regular exercise  Injury prevention: Discussed safety belts, safety helmets, smoke detector, smoking near bedding or upholstery.   Dental health: Discussed importance of regular tooth brushing, flossing, and dental visits.    NEXT PREVENTATIVE PHYSICAL DUE IN 1 YEAR. No follow-ups on file.

## 2023-12-25 ENCOUNTER — Encounter: Payer: Self-pay | Admitting: Nurse Practitioner

## 2023-12-25 LAB — CBC WITH DIFFERENTIAL/PLATELET
Basophils Absolute: 0 10*3/uL (ref 0.0–0.2)
Basos: 1 %
EOS (ABSOLUTE): 0.1 10*3/uL (ref 0.0–0.4)
Eos: 1 %
Hematocrit: 41.8 % (ref 34.0–46.6)
Hemoglobin: 13.5 g/dL (ref 11.1–15.9)
Immature Grans (Abs): 0 10*3/uL (ref 0.0–0.1)
Immature Granulocytes: 0 %
Lymphocytes Absolute: 2.5 10*3/uL (ref 0.7–3.1)
Lymphs: 42 %
MCH: 28.8 pg (ref 26.6–33.0)
MCHC: 32.3 g/dL (ref 31.5–35.7)
MCV: 89 fL (ref 79–97)
Monocytes Absolute: 0.5 10*3/uL (ref 0.1–0.9)
Monocytes: 8 %
Neutrophils Absolute: 3 10*3/uL (ref 1.4–7.0)
Neutrophils: 48 %
Platelets: 277 10*3/uL (ref 150–450)
RBC: 4.69 x10E6/uL (ref 3.77–5.28)
RDW: 12.2 % (ref 11.7–15.4)
WBC: 6.1 10*3/uL (ref 3.4–10.8)

## 2023-12-25 LAB — COMPREHENSIVE METABOLIC PANEL
ALT: 14 [IU]/L (ref 0–32)
AST: 18 [IU]/L (ref 0–40)
Albumin: 4.7 g/dL (ref 4.0–5.0)
Alkaline Phosphatase: 60 [IU]/L (ref 44–121)
BUN/Creatinine Ratio: 16 (ref 9–23)
BUN: 13 mg/dL (ref 6–20)
Bilirubin Total: 0.3 mg/dL (ref 0.0–1.2)
CO2: 20 mmol/L (ref 20–29)
Calcium: 10.1 mg/dL (ref 8.7–10.2)
Chloride: 102 mmol/L (ref 96–106)
Creatinine, Ser: 0.8 mg/dL (ref 0.57–1.00)
Globulin, Total: 2.9 g/dL (ref 1.5–4.5)
Glucose: 87 mg/dL (ref 70–99)
Potassium: 4.4 mmol/L (ref 3.5–5.2)
Sodium: 139 mmol/L (ref 134–144)
Total Protein: 7.6 g/dL (ref 6.0–8.5)
eGFR: 107 mL/min/{1.73_m2} (ref >59)

## 2023-12-25 LAB — HEPATITIS C ANTIBODY: Hep C Virus Ab: NONREACTIVE

## 2023-12-25 LAB — LIPID PANEL
Chol/HDL Ratio: 2.9 {ratio} (ref 0.0–4.4)
Cholesterol, Total: 175 mg/dL (ref 100–199)
HDL: 61 mg/dL (ref >39)
LDL Chol Calc (NIH): 97 mg/dL (ref 0–99)
Triglycerides: 91 mg/dL (ref 0–149)
VLDL Cholesterol Cal: 17 mg/dL (ref 5–40)

## 2023-12-25 LAB — T4, FREE: Free T4: 1.17 ng/dL (ref 0.82–1.77)

## 2023-12-25 LAB — HIV ANTIBODY (ROUTINE TESTING W REFLEX): HIV Screen 4th Generation wRfx: NONREACTIVE

## 2023-12-25 LAB — TSH: TSH: 2.43 u[IU]/mL (ref 0.450–4.500)

## 2023-12-28 LAB — GC/CHLAMYDIA PROBE AMP
Chlamydia trachomatis, NAA: NEGATIVE
Neisseria Gonorrhoeae by PCR: NEGATIVE

## 2024-02-13 DIAGNOSIS — D351 Benign neoplasm of parathyroid gland: Secondary | ICD-10-CM | POA: Diagnosis not present

## 2024-02-13 DIAGNOSIS — Z483 Aftercare following surgery for neoplasm: Secondary | ICD-10-CM | POA: Diagnosis not present

## 2024-03-18 DIAGNOSIS — R5383 Other fatigue: Secondary | ICD-10-CM | POA: Diagnosis not present

## 2024-04-06 DIAGNOSIS — F411 Generalized anxiety disorder: Secondary | ICD-10-CM | POA: Diagnosis not present

## 2024-04-13 DIAGNOSIS — M9903 Segmental and somatic dysfunction of lumbar region: Secondary | ICD-10-CM | POA: Diagnosis not present

## 2024-04-13 DIAGNOSIS — M545 Low back pain, unspecified: Secondary | ICD-10-CM | POA: Diagnosis not present

## 2024-04-13 DIAGNOSIS — M62830 Muscle spasm of back: Secondary | ICD-10-CM | POA: Diagnosis not present

## 2024-04-13 DIAGNOSIS — M62451 Contracture of muscle, right thigh: Secondary | ICD-10-CM | POA: Diagnosis not present

## 2024-04-13 DIAGNOSIS — M9901 Segmental and somatic dysfunction of cervical region: Secondary | ICD-10-CM | POA: Diagnosis not present

## 2024-04-13 DIAGNOSIS — M9906 Segmental and somatic dysfunction of lower extremity: Secondary | ICD-10-CM | POA: Diagnosis not present

## 2024-04-15 DIAGNOSIS — F411 Generalized anxiety disorder: Secondary | ICD-10-CM | POA: Diagnosis not present

## 2024-04-16 DIAGNOSIS — M9903 Segmental and somatic dysfunction of lumbar region: Secondary | ICD-10-CM | POA: Diagnosis not present

## 2024-04-16 DIAGNOSIS — M62830 Muscle spasm of back: Secondary | ICD-10-CM | POA: Diagnosis not present

## 2024-04-16 DIAGNOSIS — M9906 Segmental and somatic dysfunction of lower extremity: Secondary | ICD-10-CM | POA: Diagnosis not present

## 2024-04-16 DIAGNOSIS — M545 Low back pain, unspecified: Secondary | ICD-10-CM | POA: Diagnosis not present

## 2024-04-16 DIAGNOSIS — M62451 Contracture of muscle, right thigh: Secondary | ICD-10-CM | POA: Diagnosis not present

## 2024-04-16 DIAGNOSIS — M9901 Segmental and somatic dysfunction of cervical region: Secondary | ICD-10-CM | POA: Diagnosis not present

## 2024-04-19 DIAGNOSIS — M545 Low back pain, unspecified: Secondary | ICD-10-CM | POA: Diagnosis not present

## 2024-04-19 DIAGNOSIS — M9903 Segmental and somatic dysfunction of lumbar region: Secondary | ICD-10-CM | POA: Diagnosis not present

## 2024-04-19 DIAGNOSIS — M62830 Muscle spasm of back: Secondary | ICD-10-CM | POA: Diagnosis not present

## 2024-04-19 DIAGNOSIS — M9901 Segmental and somatic dysfunction of cervical region: Secondary | ICD-10-CM | POA: Diagnosis not present

## 2024-04-19 DIAGNOSIS — M62451 Contracture of muscle, right thigh: Secondary | ICD-10-CM | POA: Diagnosis not present

## 2024-04-19 DIAGNOSIS — M9906 Segmental and somatic dysfunction of lower extremity: Secondary | ICD-10-CM | POA: Diagnosis not present

## 2024-04-21 DIAGNOSIS — F411 Generalized anxiety disorder: Secondary | ICD-10-CM | POA: Diagnosis not present

## 2024-04-22 DIAGNOSIS — M9901 Segmental and somatic dysfunction of cervical region: Secondary | ICD-10-CM | POA: Diagnosis not present

## 2024-04-22 DIAGNOSIS — M545 Low back pain, unspecified: Secondary | ICD-10-CM | POA: Diagnosis not present

## 2024-04-22 DIAGNOSIS — M62830 Muscle spasm of back: Secondary | ICD-10-CM | POA: Diagnosis not present

## 2024-04-22 DIAGNOSIS — M9906 Segmental and somatic dysfunction of lower extremity: Secondary | ICD-10-CM | POA: Diagnosis not present

## 2024-04-22 DIAGNOSIS — M62451 Contracture of muscle, right thigh: Secondary | ICD-10-CM | POA: Diagnosis not present

## 2024-04-22 DIAGNOSIS — M9903 Segmental and somatic dysfunction of lumbar region: Secondary | ICD-10-CM | POA: Diagnosis not present

## 2024-04-26 DIAGNOSIS — M9903 Segmental and somatic dysfunction of lumbar region: Secondary | ICD-10-CM | POA: Diagnosis not present

## 2024-04-26 DIAGNOSIS — M545 Low back pain, unspecified: Secondary | ICD-10-CM | POA: Diagnosis not present

## 2024-04-26 DIAGNOSIS — M62451 Contracture of muscle, right thigh: Secondary | ICD-10-CM | POA: Diagnosis not present

## 2024-04-26 DIAGNOSIS — F411 Generalized anxiety disorder: Secondary | ICD-10-CM | POA: Diagnosis not present

## 2024-04-26 DIAGNOSIS — M9901 Segmental and somatic dysfunction of cervical region: Secondary | ICD-10-CM | POA: Diagnosis not present

## 2024-04-26 DIAGNOSIS — M62830 Muscle spasm of back: Secondary | ICD-10-CM | POA: Diagnosis not present

## 2024-04-26 DIAGNOSIS — M9906 Segmental and somatic dysfunction of lower extremity: Secondary | ICD-10-CM | POA: Diagnosis not present

## 2024-04-27 DIAGNOSIS — F411 Generalized anxiety disorder: Secondary | ICD-10-CM | POA: Diagnosis not present

## 2024-04-29 DIAGNOSIS — M9901 Segmental and somatic dysfunction of cervical region: Secondary | ICD-10-CM | POA: Diagnosis not present

## 2024-04-29 DIAGNOSIS — M62830 Muscle spasm of back: Secondary | ICD-10-CM | POA: Diagnosis not present

## 2024-04-29 DIAGNOSIS — M9903 Segmental and somatic dysfunction of lumbar region: Secondary | ICD-10-CM | POA: Diagnosis not present

## 2024-04-29 DIAGNOSIS — M545 Low back pain, unspecified: Secondary | ICD-10-CM | POA: Diagnosis not present

## 2024-04-29 DIAGNOSIS — M62451 Contracture of muscle, right thigh: Secondary | ICD-10-CM | POA: Diagnosis not present

## 2024-04-29 DIAGNOSIS — M9906 Segmental and somatic dysfunction of lower extremity: Secondary | ICD-10-CM | POA: Diagnosis not present

## 2024-05-03 DIAGNOSIS — M62830 Muscle spasm of back: Secondary | ICD-10-CM | POA: Diagnosis not present

## 2024-05-03 DIAGNOSIS — M9903 Segmental and somatic dysfunction of lumbar region: Secondary | ICD-10-CM | POA: Diagnosis not present

## 2024-05-03 DIAGNOSIS — M545 Low back pain, unspecified: Secondary | ICD-10-CM | POA: Diagnosis not present

## 2024-05-03 DIAGNOSIS — M9906 Segmental and somatic dysfunction of lower extremity: Secondary | ICD-10-CM | POA: Diagnosis not present

## 2024-05-03 DIAGNOSIS — M9901 Segmental and somatic dysfunction of cervical region: Secondary | ICD-10-CM | POA: Diagnosis not present

## 2024-05-03 DIAGNOSIS — M62451 Contracture of muscle, right thigh: Secondary | ICD-10-CM | POA: Diagnosis not present

## 2024-05-06 DIAGNOSIS — M9906 Segmental and somatic dysfunction of lower extremity: Secondary | ICD-10-CM | POA: Diagnosis not present

## 2024-05-06 DIAGNOSIS — M62451 Contracture of muscle, right thigh: Secondary | ICD-10-CM | POA: Diagnosis not present

## 2024-05-06 DIAGNOSIS — M545 Low back pain, unspecified: Secondary | ICD-10-CM | POA: Diagnosis not present

## 2024-05-06 DIAGNOSIS — M9903 Segmental and somatic dysfunction of lumbar region: Secondary | ICD-10-CM | POA: Diagnosis not present

## 2024-05-06 DIAGNOSIS — M62830 Muscle spasm of back: Secondary | ICD-10-CM | POA: Diagnosis not present

## 2024-05-06 DIAGNOSIS — R293 Abnormal posture: Secondary | ICD-10-CM | POA: Diagnosis not present

## 2024-05-10 DIAGNOSIS — F411 Generalized anxiety disorder: Secondary | ICD-10-CM | POA: Diagnosis not present

## 2024-05-18 DIAGNOSIS — R293 Abnormal posture: Secondary | ICD-10-CM | POA: Diagnosis not present

## 2024-05-18 DIAGNOSIS — M545 Low back pain, unspecified: Secondary | ICD-10-CM | POA: Diagnosis not present

## 2024-05-18 DIAGNOSIS — M62830 Muscle spasm of back: Secondary | ICD-10-CM | POA: Diagnosis not present

## 2024-05-18 DIAGNOSIS — M62451 Contracture of muscle, right thigh: Secondary | ICD-10-CM | POA: Diagnosis not present

## 2024-05-18 DIAGNOSIS — M9906 Segmental and somatic dysfunction of lower extremity: Secondary | ICD-10-CM | POA: Diagnosis not present

## 2024-05-18 DIAGNOSIS — M9903 Segmental and somatic dysfunction of lumbar region: Secondary | ICD-10-CM | POA: Diagnosis not present

## 2024-05-19 DIAGNOSIS — F411 Generalized anxiety disorder: Secondary | ICD-10-CM | POA: Diagnosis not present

## 2024-05-26 DIAGNOSIS — F411 Generalized anxiety disorder: Secondary | ICD-10-CM | POA: Diagnosis not present

## 2024-06-10 DIAGNOSIS — F411 Generalized anxiety disorder: Secondary | ICD-10-CM | POA: Diagnosis not present

## 2024-06-21 DIAGNOSIS — M9903 Segmental and somatic dysfunction of lumbar region: Secondary | ICD-10-CM | POA: Diagnosis not present

## 2024-06-21 DIAGNOSIS — M62830 Muscle spasm of back: Secondary | ICD-10-CM | POA: Diagnosis not present

## 2024-06-21 DIAGNOSIS — M62451 Contracture of muscle, right thigh: Secondary | ICD-10-CM | POA: Diagnosis not present

## 2024-06-21 DIAGNOSIS — R293 Abnormal posture: Secondary | ICD-10-CM | POA: Diagnosis not present

## 2024-06-21 DIAGNOSIS — M9906 Segmental and somatic dysfunction of lower extremity: Secondary | ICD-10-CM | POA: Diagnosis not present

## 2024-06-21 DIAGNOSIS — M545 Low back pain, unspecified: Secondary | ICD-10-CM | POA: Diagnosis not present

## 2024-07-26 DIAGNOSIS — F411 Generalized anxiety disorder: Secondary | ICD-10-CM | POA: Diagnosis not present

## 2024-08-03 DIAGNOSIS — F411 Generalized anxiety disorder: Secondary | ICD-10-CM | POA: Diagnosis not present

## 2024-08-12 DIAGNOSIS — F411 Generalized anxiety disorder: Secondary | ICD-10-CM | POA: Diagnosis not present

## 2024-08-24 DIAGNOSIS — F411 Generalized anxiety disorder: Secondary | ICD-10-CM | POA: Diagnosis not present

## 2024-09-07 DIAGNOSIS — F411 Generalized anxiety disorder: Secondary | ICD-10-CM | POA: Diagnosis not present

## 2024-09-17 DIAGNOSIS — D1801 Hemangioma of skin and subcutaneous tissue: Secondary | ICD-10-CM | POA: Diagnosis not present

## 2024-09-17 DIAGNOSIS — L700 Acne vulgaris: Secondary | ICD-10-CM | POA: Diagnosis not present

## 2024-09-20 DIAGNOSIS — F411 Generalized anxiety disorder: Secondary | ICD-10-CM | POA: Diagnosis not present
